# Patient Record
Sex: Male | Born: 1955 | Race: White | Hispanic: No | Marital: Married | Smoking: Former smoker
Health system: Southern US, Community
[De-identification: ages and names within clinical notes are randomized; demographics above are authoritative.]

## PROBLEM LIST (undated history)

## (undated) DIAGNOSIS — K219 Gastro-esophageal reflux disease without esophagitis: Secondary | ICD-10-CM

## (undated) DIAGNOSIS — F3130 Bipolar disorder, current episode depressed, mild or moderate severity, unspecified: Secondary | ICD-10-CM

## (undated) DIAGNOSIS — R251 Tremor, unspecified: Secondary | ICD-10-CM

## (undated) DIAGNOSIS — G4730 Sleep apnea, unspecified: Secondary | ICD-10-CM

## (undated) DIAGNOSIS — M549 Dorsalgia, unspecified: Secondary | ICD-10-CM

## (undated) DIAGNOSIS — H9190 Unspecified hearing loss, unspecified ear: Secondary | ICD-10-CM

## (undated) DIAGNOSIS — I499 Cardiac arrhythmia, unspecified: Secondary | ICD-10-CM

## (undated) DIAGNOSIS — M1990 Unspecified osteoarthritis, unspecified site: Secondary | ICD-10-CM

## (undated) DIAGNOSIS — G629 Polyneuropathy, unspecified: Secondary | ICD-10-CM

## (undated) HISTORY — DX: Bipolar disorder, current episode depressed, mild or moderate severity, unspecified: F31.30

## (undated) HISTORY — PX: WISDOM TOOTH EXTRACTION: SHX21

## (undated) HISTORY — DX: Cardiac arrhythmia, unspecified: I49.9

## (undated) HISTORY — DX: Gastro-esophageal reflux disease without esophagitis: K21.9

## (undated) HISTORY — PX: CERVICAL SPINE SURGERY: SHX589

## (undated) HISTORY — DX: Unspecified hearing loss, unspecified ear: H91.90

## (undated) HISTORY — DX: Polyneuropathy, unspecified: G62.9

## (undated) HISTORY — PX: TONSILLECTOMY: SUR1361

## (undated) HISTORY — PX: BACK SURGERY: SHX140

---

## 2001-04-20 HISTORY — PX: KNEE SURGERY: SHX244

## 2003-03-13 ENCOUNTER — Other Ambulatory Visit: Payer: Self-pay

## 2004-01-24 ENCOUNTER — Ambulatory Visit: Payer: Self-pay | Admitting: Orthopedic Surgery

## 2004-03-21 ENCOUNTER — Emergency Department: Payer: Self-pay | Admitting: Emergency Medicine

## 2005-03-02 ENCOUNTER — Ambulatory Visit: Payer: Self-pay | Admitting: Physician Assistant

## 2005-07-10 ENCOUNTER — Emergency Department: Payer: Self-pay | Admitting: Emergency Medicine

## 2006-01-03 ENCOUNTER — Emergency Department: Payer: Self-pay | Admitting: General Practice

## 2007-02-20 ENCOUNTER — Emergency Department: Payer: Self-pay | Admitting: Emergency Medicine

## 2007-02-28 ENCOUNTER — Ambulatory Visit: Payer: Self-pay | Admitting: Family Medicine

## 2007-04-22 ENCOUNTER — Emergency Department: Payer: Self-pay | Admitting: Emergency Medicine

## 2007-04-28 ENCOUNTER — Emergency Department: Payer: Self-pay | Admitting: Emergency Medicine

## 2007-05-26 ENCOUNTER — Ambulatory Visit: Payer: Self-pay | Admitting: Gastroenterology

## 2007-08-13 ENCOUNTER — Emergency Department: Payer: Self-pay | Admitting: Emergency Medicine

## 2007-08-14 ENCOUNTER — Ambulatory Visit: Payer: Self-pay | Admitting: Family Medicine

## 2007-08-17 ENCOUNTER — Ambulatory Visit: Payer: Self-pay | Admitting: Internal Medicine

## 2008-05-28 ENCOUNTER — Ambulatory Visit: Payer: Self-pay | Admitting: Internal Medicine

## 2008-07-25 ENCOUNTER — Ambulatory Visit: Payer: Self-pay | Admitting: Internal Medicine

## 2008-07-27 ENCOUNTER — Ambulatory Visit: Payer: Self-pay | Admitting: Internal Medicine

## 2008-08-20 ENCOUNTER — Ambulatory Visit: Payer: Self-pay | Admitting: Unknown Physician Specialty

## 2008-08-21 ENCOUNTER — Ambulatory Visit: Payer: Self-pay | Admitting: Unknown Physician Specialty

## 2010-10-16 ENCOUNTER — Ambulatory Visit: Payer: Self-pay

## 2011-02-22 ENCOUNTER — Inpatient Hospital Stay: Payer: Self-pay | Admitting: Internal Medicine

## 2011-02-26 DIAGNOSIS — I369 Nonrheumatic tricuspid valve disorder, unspecified: Secondary | ICD-10-CM

## 2011-02-27 DIAGNOSIS — I4891 Unspecified atrial fibrillation: Secondary | ICD-10-CM

## 2011-03-11 ENCOUNTER — Ambulatory Visit: Payer: Self-pay | Admitting: Internal Medicine

## 2012-01-18 ENCOUNTER — Ambulatory Visit: Payer: Self-pay | Admitting: Otolaryngology

## 2012-02-03 ENCOUNTER — Ambulatory Visit: Payer: Self-pay | Admitting: Ophthalmology

## 2012-05-20 ENCOUNTER — Ambulatory Visit: Payer: Self-pay | Admitting: Family Medicine

## 2012-06-06 ENCOUNTER — Other Ambulatory Visit (HOSPITAL_COMMUNITY): Payer: Self-pay

## 2012-06-07 ENCOUNTER — Other Ambulatory Visit (HOSPITAL_COMMUNITY): Payer: Self-pay | Admitting: Neurology

## 2012-06-07 DIAGNOSIS — G8190 Hemiplegia, unspecified affecting unspecified side: Secondary | ICD-10-CM

## 2012-06-10 ENCOUNTER — Ambulatory Visit (HOSPITAL_COMMUNITY): Payer: Medicaid Other | Attending: Internal Medicine

## 2012-06-10 DIAGNOSIS — G8190 Hemiplegia, unspecified affecting unspecified side: Secondary | ICD-10-CM

## 2012-06-10 DIAGNOSIS — I080 Rheumatic disorders of both mitral and aortic valves: Secondary | ICD-10-CM | POA: Insufficient documentation

## 2012-06-10 NOTE — Progress Notes (Signed)
Echocardiogram performed.  

## 2012-06-13 ENCOUNTER — Encounter (HOSPITAL_COMMUNITY): Payer: Self-pay | Admitting: Neurology

## 2012-07-25 DIAGNOSIS — Z0289 Encounter for other administrative examinations: Secondary | ICD-10-CM

## 2012-09-30 ENCOUNTER — Ambulatory Visit: Payer: Self-pay | Admitting: Neurology

## 2012-10-19 ENCOUNTER — Encounter: Payer: Self-pay | Admitting: General Surgery

## 2012-10-29 ENCOUNTER — Emergency Department: Payer: Self-pay | Admitting: Emergency Medicine

## 2012-11-14 ENCOUNTER — Encounter: Payer: Self-pay | Admitting: General Surgery

## 2012-11-14 ENCOUNTER — Inpatient Hospital Stay
Admission: RE | Admit: 2012-11-14 | Discharge: 2012-11-14 | Disposition: A | Discharge status: Home / Self Care | Payer: Self-pay | Source: Ambulatory Visit | Attending: General Surgery | Admitting: General Surgery

## 2012-11-14 ENCOUNTER — Ambulatory Visit (INDEPENDENT_AMBULATORY_CARE_PROVIDER_SITE_OTHER): Payer: Medicare Other | Admitting: General Surgery

## 2012-11-14 VITALS — BP 124/70 | HR 74 | Resp 12 | Ht 69.0 in | Wt 224.0 lb

## 2012-11-14 DIAGNOSIS — Z803 Family history of malignant neoplasm of breast: Secondary | ICD-10-CM

## 2012-11-14 DIAGNOSIS — N644 Mastodynia: Secondary | ICD-10-CM

## 2012-11-14 NOTE — Patient Instructions (Addendum)
Patient has been scheduled for a bilateral diagnostic mammogram at [facility] for 11-15-12 at 12:30 pm. He is aware of date, time, and instructions.

## 2012-11-14 NOTE — Progress Notes (Signed)
Patient ID: Jake Richards, male   DOB: July 13, 1955, 57 y.o.   MRN: [id number]  Chief Complaint  Patient presents with  . Other    breast pain    HPI Derron Pipkins is a 57 y.o. male  Here for bilateral breast pain that started 6-8 months ago. Pain seems worse with palpation or even light pressure or dressing. Pain is described as a sharp pain that is short in duration. Patient says this pain has been getting worse. Patient reports no breast enlargement or nipple discharge.  The primary care notes of April 2014 reported his symptoms have been ongoing about 4-6 weeks.  The patient reports of the right breast is significantly more uncomfortable the left. In the left breast, discomfort is limited to the area of the nipple. In the right breast a 6 cm area center of the nipple/areola is symptomatic with light touch.  The patient has never appreciated any mass in the breast. Of note, the patient's symptoms began within one month of beginning Cymbalta and Lamictal.  HPI  Past Medical History  Diagnosis Date  . Bipolar affect, depressed   . Hearing loss   . Neuropathy     Past Surgical History  Procedure Laterality Date  . Knee surgery Left 2003  . Back surgery  2000,2010    Family History  Problem Relation Age of Onset  . Breast cancer Daughter     Social History History  Substance Use Topics  . Smoking status: Former Smoker    Types: Cigarettes    Quit date: 04/20/2002  . Smokeless tobacco: Never Used  . Alcohol Use: Yes    Allergies  Allergen Reactions  . Latex Shortness Of Breath    Peels skin off  . Penicillins Shortness Of Breath    Current Outpatient Prescriptions  Medication Sig Dispense Refill  . citalopram (CELEXA) 20 MG tablet Take 1 tablet by mouth daily. Take 20 mg by mouth daily.      . DULoxetine (CYMBALTA) 30 MG capsule Take 1 capsule by mouth 2 (two) times daily. Take 30 mg by mouth daily.      . fluticasone (FLONASE) 50 MCG/ACT nasal spray Place 1 spray into  the nose. Place 2 sprays into both nostrils daily.      Marland Kitchen gabapentin (NEURONTIN) 400 MG capsule Take 1 capsule by mouth 3 (three) times daily. Take 400 mg by mouth 3 (three) times daily.      Marland Kitchen lamoTRIgine (LAMICTAL) 100 MG tablet Take 1 tablet by mouth daily. Take 100 mg by mouth daily.      . tamsulosin (FLOMAX) 0.4 MG CAPS Take 0.4 mg by mouth daily.      Marland Kitchen aspirin 81 MG tablet Take 81 mg by mouth daily.       No current facility-administered medications for this visit.    Review of Systems Review of Systems  Constitutional: Negative.   Respiratory: Positive for shortness of breath (after weight gain). Negative for apnea, cough, choking, chest tightness, wheezing and stridor.   Cardiovascular: Negative.     Blood pressure 124/70, pulse 74, resp. rate 12, height 5\' 9"  (1.753 m), weight 224 lb (101.606 kg).  Physical Exam Physical Exam  Constitutional: He is oriented to person, place, and time. He appears well-developed and well-nourished.  Neck: Neck supple.  Cardiovascular: Normal rate and regular rhythm.   Pulmonary/Chest: Effort normal and breath sounds normal. Right breast exhibits tenderness (upper outer quadrant). Right breast exhibits no inverted nipple, no mass, no nipple discharge  and no skin change. Left breast exhibits no inverted nipple, no mass, no nipple discharge, no skin change and no tenderness.  Lymphadenopathy:    He has no cervical adenopathy.    He has no axillary adenopathy.  Neurological: He is alert and oriented to person, place, and time.  There is no visual erythema, induration or skin thickening appreciated in either breast. The nipple areolar complex is normal. There is tenderness in the 9 to 11:00 position of the right breast without a discernible mass or focal thickening appreciated.  Data Reviewed Extensive laboratory studies April 2014 were reviewed. Normal testosterone, estrogen and prolactin levels appreciated.  Ultrasound examination of the right  breast from the 8 to 12:00 position with special attention in the upper-outer quadrant at the 10:00 position was completed. No evidence of architectural distortion, cystic or solid lesions identified.  Limited examination of the retroareolar tissue bilaterally showed no abnormality.  Assessment    Mastalgia, likely medication induced.     Plan    Mammograms will be obtained to complete his evaluation.  The patient reports his lower had breast cancer in her 30s. He was asked to find out whether she was tested for BRCA  Abnormality. If positive, the patient would be a candidate for similar testing.     Patient has been scheduled for a bilateral diagnostic mammogram at [facility] for 11-15-12 at 12:30 pm. He is aware of date, time, and instructions.    Earline Mayotte 11/14/2012, 8:23 PM

## 2012-11-15 ENCOUNTER — Ambulatory Visit: Payer: Self-pay | Admitting: General Surgery

## 2013-02-23 ENCOUNTER — Other Ambulatory Visit: Payer: Self-pay

## 2013-06-16 ENCOUNTER — Ambulatory Visit: Payer: Self-pay | Admitting: Otolaryngology

## 2013-08-09 ENCOUNTER — Emergency Department: Payer: Self-pay | Admitting: Emergency Medicine

## 2013-08-09 LAB — BASIC METABOLIC PANEL
Anion Gap: 6 — ABNORMAL LOW (ref 7–16)
BUN: 12 mg/dL (ref 7–18)
CHLORIDE: 107 mmol/L (ref 98–107)
Calcium, Total: 8.7 mg/dL (ref 8.5–10.1)
Co2: 27 mmol/L (ref 21–32)
Creatinine: 1.02 mg/dL (ref 0.60–1.30)
EGFR (African American): >60
GLUCOSE: 160 mg/dL — AB (ref 65–99)
OSMOLALITY: 283 (ref 275–301)
Potassium: 3.8 mmol/L (ref 3.5–5.1)
SODIUM: 140 mmol/L (ref 136–145)

## 2013-08-09 LAB — CBC
HCT: 47.5 % (ref 40.0–52.0)
HGB: 16.2 g/dL (ref 13.0–18.0)
MCH: 32 pg (ref 26.0–34.0)
MCHC: 34.1 g/dL (ref 32.0–36.0)
MCV: 94 fL (ref 80–100)
PLATELETS: 257 10*3/uL (ref 150–440)
RBC: 5.06 10*6/uL (ref 4.40–5.90)
RDW: 13.6 % (ref 11.5–14.5)
WBC: 11.8 10*3/uL — ABNORMAL HIGH (ref 3.8–10.6)

## 2013-08-10 LAB — TROPONIN I
Troponin-I: <0.02 ng/mL
Troponin-I: <0.02 ng/mL

## 2014-03-07 ENCOUNTER — Emergency Department: Payer: Self-pay | Admitting: Emergency Medicine

## 2014-03-20 ENCOUNTER — Ambulatory Visit: Payer: Self-pay | Admitting: Neurology

## 2014-04-04 ENCOUNTER — Ambulatory Visit: Payer: Self-pay | Admitting: Pain Medicine

## 2014-04-18 ENCOUNTER — Ambulatory Visit: Payer: Self-pay | Admitting: Pain Medicine

## 2014-06-08 ENCOUNTER — Ambulatory Visit: Payer: Self-pay | Admitting: Pain Medicine

## 2014-07-10 ENCOUNTER — Ambulatory Visit: Payer: Self-pay | Admitting: Pain Medicine

## 2014-07-18 ENCOUNTER — Ambulatory Visit
Admit: 2014-07-18 | Disposition: A | Discharge status: Home / Self Care | Payer: Self-pay | Attending: Pain Medicine | Admitting: Pain Medicine

## 2014-08-08 ENCOUNTER — Ambulatory Visit
Admit: 2014-08-08 | Disposition: A | Discharge status: Home / Self Care | Payer: Self-pay | Attending: Pain Medicine | Admitting: Pain Medicine

## 2014-09-11 ENCOUNTER — Ambulatory Visit: Payer: Medicaid Other | Admitting: Pain Medicine

## 2015-03-10 ENCOUNTER — Emergency Department
Admission: EM | Admit: 2015-03-10 | Discharge: 2015-03-10 | Disposition: A | Discharge status: Home / Self Care | Payer: Medicare Other | Attending: Emergency Medicine | Admitting: Emergency Medicine

## 2015-03-10 DIAGNOSIS — R3989 Other symptoms and signs involving the genitourinary system: Secondary | ICD-10-CM | POA: Insufficient documentation

## 2015-03-10 DIAGNOSIS — Z91040 Latex allergy status: Secondary | ICD-10-CM | POA: Insufficient documentation

## 2015-03-10 DIAGNOSIS — M549 Dorsalgia, unspecified: Secondary | ICD-10-CM | POA: Diagnosis not present

## 2015-03-10 DIAGNOSIS — Z880 Allergy status to penicillin: Secondary | ICD-10-CM | POA: Diagnosis not present

## 2015-03-10 DIAGNOSIS — Z7982 Long term (current) use of aspirin: Secondary | ICD-10-CM | POA: Insufficient documentation

## 2015-03-10 DIAGNOSIS — Z79899 Other long term (current) drug therapy: Secondary | ICD-10-CM | POA: Insufficient documentation

## 2015-03-10 DIAGNOSIS — R109 Unspecified abdominal pain: Secondary | ICD-10-CM | POA: Diagnosis not present

## 2015-03-10 DIAGNOSIS — R300 Dysuria: Secondary | ICD-10-CM | POA: Diagnosis present

## 2015-03-10 DIAGNOSIS — Z87891 Personal history of nicotine dependence: Secondary | ICD-10-CM | POA: Diagnosis not present

## 2015-03-10 LAB — BASIC METABOLIC PANEL
Anion gap: 6 (ref 5–15)
BUN: 11 mg/dL (ref 6–20)
CHLORIDE: 106 mmol/L (ref 101–111)
CO2: 28 mmol/L (ref 22–32)
CREATININE: 1 mg/dL (ref 0.61–1.24)
Calcium: 9 mg/dL (ref 8.9–10.3)
GFR calc non Af Amer: >60 mL/min (ref >60)
Glucose, Bld: 105 mg/dL — ABNORMAL HIGH (ref 65–99)
Potassium: 3.4 mmol/L — ABNORMAL LOW (ref 3.5–5.1)
Sodium: 140 mmol/L (ref 135–145)

## 2015-03-10 LAB — URINALYSIS COMPLETE WITH MICROSCOPIC (ARMC ONLY)
Bacteria, UA: NONE SEEN
Bilirubin Urine: NEGATIVE
GLUCOSE, UA: NEGATIVE mg/dL
Hgb urine dipstick: NEGATIVE
KETONES UR: NEGATIVE mg/dL
Leukocytes, UA: NEGATIVE
Nitrite: NEGATIVE
PROTEIN: NEGATIVE mg/dL
Specific Gravity, Urine: 1.018 (ref 1.005–1.030)
Squamous Epithelial / LPF: NONE SEEN
pH: 6 (ref 5.0–8.0)

## 2015-03-10 LAB — CBC WITH DIFFERENTIAL/PLATELET
Basophils Absolute: 0.1 10*3/uL (ref 0–0.1)
Basophils Relative: 1 %
EOS ABS: 0.3 10*3/uL (ref 0–0.7)
Eosinophils Relative: 4 %
HCT: 39.6 % — ABNORMAL LOW (ref 40.0–52.0)
HEMOGLOBIN: 13.5 g/dL (ref 13.0–18.0)
LYMPHS ABS: 2.4 10*3/uL (ref 1.0–3.6)
Lymphocytes Relative: 33 %
MCH: 31.5 pg (ref 26.0–34.0)
MCHC: 34.1 g/dL (ref 32.0–36.0)
MCV: 92.3 fL (ref 80.0–100.0)
Monocytes Absolute: 0.9 10*3/uL (ref 0.2–1.0)
Monocytes Relative: 12 %
Neutro Abs: 3.7 10*3/uL (ref 1.4–6.5)
Neutrophils Relative %: 50 %
PLATELETS: 240 10*3/uL (ref 150–440)
RBC: 4.28 MIL/uL — ABNORMAL LOW (ref 4.40–5.90)
RDW: 13.5 % (ref 11.5–14.5)
WBC: 7.3 10*3/uL (ref 3.8–10.6)

## 2015-03-10 MED ORDER — SODIUM CHLORIDE 0.9 % IV BOLUS (SEPSIS)
1000.0000 mL | Freq: Once | INTRAVENOUS | Status: AC
  Administered 2015-03-10: 1000 mL via INTRAVENOUS
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
## 2015-03-10 NOTE — ED Notes (Signed)
Pt placed on 2L, pt asleep in the bed, O2 dropped to 88%. Pt now 95% on 2L.

## 2015-03-10 NOTE — ED Notes (Signed)
Bladder scan 282mL.
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
## 2015-03-10 NOTE — ED Notes (Signed)
Pt continues to sleep at this time. NAD noted. Pt's son at bedside. Awaiting MD at this time.
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
## 2015-03-10 NOTE — ED Notes (Signed)
NAD Noted at this time. Pt taken to the lobby via wheelchair. Pt denies questions/concerns at this time.

## 2015-05-27 ENCOUNTER — Emergency Department: Payer: Medicare Other

## 2015-05-27 ENCOUNTER — Emergency Department
Admission: EM | Admit: 2015-05-27 | Discharge: 2015-05-27 | Disposition: A | Discharge status: Home / Self Care | Payer: Medicare Other | Attending: Emergency Medicine | Admitting: Emergency Medicine

## 2015-05-27 DIAGNOSIS — Y9389 Activity, other specified: Secondary | ICD-10-CM | POA: Insufficient documentation

## 2015-05-27 DIAGNOSIS — X58XXXA Exposure to other specified factors, initial encounter: Secondary | ICD-10-CM | POA: Diagnosis not present

## 2015-05-27 DIAGNOSIS — S46811A Strain of other muscles, fascia and tendons at shoulder and upper arm level, right arm, initial encounter: Secondary | ICD-10-CM | POA: Insufficient documentation

## 2015-05-27 DIAGNOSIS — Y998 Other external cause status: Secondary | ICD-10-CM | POA: Diagnosis not present

## 2015-05-27 DIAGNOSIS — Z792 Long term (current) use of antibiotics: Secondary | ICD-10-CM | POA: Diagnosis not present

## 2015-05-27 DIAGNOSIS — Z7982 Long term (current) use of aspirin: Secondary | ICD-10-CM | POA: Diagnosis not present

## 2015-05-27 DIAGNOSIS — Z79899 Other long term (current) drug therapy: Secondary | ICD-10-CM | POA: Diagnosis not present

## 2015-05-27 DIAGNOSIS — Z880 Allergy status to penicillin: Secondary | ICD-10-CM | POA: Diagnosis not present

## 2015-05-27 DIAGNOSIS — Z87891 Personal history of nicotine dependence: Secondary | ICD-10-CM | POA: Insufficient documentation

## 2015-05-27 DIAGNOSIS — Z91040 Latex allergy status: Secondary | ICD-10-CM | POA: Diagnosis not present

## 2015-05-27 DIAGNOSIS — Y9289 Other specified places as the place of occurrence of the external cause: Secondary | ICD-10-CM | POA: Insufficient documentation

## 2015-05-27 DIAGNOSIS — S4991XA Unspecified injury of right shoulder and upper arm, initial encounter: Secondary | ICD-10-CM | POA: Diagnosis present

## 2015-05-27 DIAGNOSIS — Z791 Long term (current) use of non-steroidal anti-inflammatories (NSAID): Secondary | ICD-10-CM | POA: Insufficient documentation

## 2015-05-27 MED ORDER — KETOROLAC TROMETHAMINE 30 MG/ML IJ SOLN
INTRAMUSCULAR | Status: AC
  Filled 2015-05-27: qty 1

## 2015-05-27 MED ORDER — OXYCODONE-ACETAMINOPHEN 5-325 MG PO TABS
1.0000 | ORAL_TABLET | Freq: Four times a day (QID) | ORAL | Status: DC | PRN
Start: 2015-05-27 — End: 2015-12-31

## 2015-05-27 MED ORDER — NAPROXEN 500 MG PO TABS: 500.0000 mg | ORAL_TABLET | Freq: Two times a day (BID) | ORAL | Status: DC

## 2015-05-27 MED ORDER — KETOROLAC TROMETHAMINE 60 MG/2ML IM SOLN
30.0000 mg | Freq: Once | INTRAMUSCULAR | Status: AC
  Administered 2015-05-27: 30 mg via INTRAMUSCULAR
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
## 2015-05-27 NOTE — Discharge Instructions (Signed)
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
## 2015-05-31 ENCOUNTER — Other Ambulatory Visit: Payer: Self-pay | Admitting: Surgery

## 2015-05-31 ENCOUNTER — Ambulatory Visit
Admission: RE | Admit: 2015-05-31 | Discharge: 2015-05-31 | Disposition: A | Discharge status: Home / Self Care | Payer: Medicare Other | Source: Ambulatory Visit | Attending: Surgery | Admitting: Surgery

## 2015-05-31 DIAGNOSIS — S46311A Strain of muscle, fascia and tendon of triceps, right arm, initial encounter: Secondary | ICD-10-CM | POA: Diagnosis present

## 2015-06-08 ENCOUNTER — Encounter: Payer: Self-pay | Admitting: Emergency Medicine

## 2015-06-08 ENCOUNTER — Emergency Department
Admission: EM | Admit: 2015-06-08 | Discharge: 2015-06-08 | Disposition: A | Discharge status: Home / Self Care | Payer: Medicare Other | Attending: Emergency Medicine | Admitting: Emergency Medicine

## 2015-06-08 DIAGNOSIS — M25511 Pain in right shoulder: Secondary | ICD-10-CM

## 2015-06-08 DIAGNOSIS — Z87891 Personal history of nicotine dependence: Secondary | ICD-10-CM | POA: Diagnosis not present

## 2015-06-08 DIAGNOSIS — Z79899 Other long term (current) drug therapy: Secondary | ICD-10-CM | POA: Diagnosis not present

## 2015-06-08 DIAGNOSIS — Z7982 Long term (current) use of aspirin: Secondary | ICD-10-CM | POA: Diagnosis not present

## 2015-06-08 DIAGNOSIS — Z791 Long term (current) use of non-steroidal anti-inflammatories (NSAID): Secondary | ICD-10-CM | POA: Diagnosis not present

## 2015-06-08 DIAGNOSIS — Z792 Long term (current) use of antibiotics: Secondary | ICD-10-CM | POA: Insufficient documentation

## 2015-06-08 DIAGNOSIS — Z91040 Latex allergy status: Secondary | ICD-10-CM | POA: Insufficient documentation

## 2015-06-08 DIAGNOSIS — R200 Anesthesia of skin: Secondary | ICD-10-CM | POA: Insufficient documentation

## 2015-06-08 DIAGNOSIS — Z880 Allergy status to penicillin: Secondary | ICD-10-CM | POA: Diagnosis not present

## 2015-06-08 DIAGNOSIS — M79601 Pain in right arm: Secondary | ICD-10-CM | POA: Diagnosis present

## 2015-06-08 MED ORDER — HYDROMORPHONE HCL 1 MG/ML IJ SOLN
1.0000 mg | Freq: Once | INTRAMUSCULAR | Status: AC
  Administered 2015-06-08: 1 mg via INTRAMUSCULAR
  Filled 2015-06-08: qty 1

## 2015-06-08 MED ORDER — GABAPENTIN 300 MG PO CAPS: ORAL_CAPSULE | ORAL | Status: DC

## 2015-06-08 MED ORDER — KETOROLAC TROMETHAMINE 60 MG/2ML IM SOLN
60.0000 mg | Freq: Once | INTRAMUSCULAR | Status: DC
Start: 2015-06-08 — End: 2015-06-08

## 2015-06-08 NOTE — Discharge Instructions (Signed)
Continue taking your current medication prescribed by Dr. Manuella Ghazi. Call Monday for further instructions.
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
## 2015-06-08 NOTE — ED Notes (Signed)
Pt states he will call ride in order to take stronger pain medication.  Pt informed pain medication won't be ordered until ride arrives.  Pt verbalized understanding
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
## 2015-08-28 ENCOUNTER — Encounter: Payer: Self-pay | Admitting: Emergency Medicine

## 2015-08-28 ENCOUNTER — Emergency Department
Admission: EM | Admit: 2015-08-28 | Discharge: 2015-08-29 | Disposition: A | Discharge status: Home / Self Care | Payer: Medicare Other | Attending: Emergency Medicine | Admitting: Emergency Medicine

## 2015-08-28 DIAGNOSIS — T672XXA Heat cramp, initial encounter: Secondary | ICD-10-CM

## 2015-08-28 DIAGNOSIS — F319 Bipolar disorder, unspecified: Secondary | ICD-10-CM | POA: Insufficient documentation

## 2015-08-28 DIAGNOSIS — Z87891 Personal history of nicotine dependence: Secondary | ICD-10-CM | POA: Diagnosis not present

## 2015-08-28 DIAGNOSIS — R9431 Abnormal electrocardiogram [ECG] [EKG]: Secondary | ICD-10-CM

## 2015-08-28 DIAGNOSIS — I4581 Long QT syndrome: Secondary | ICD-10-CM | POA: Insufficient documentation

## 2015-08-28 DIAGNOSIS — X30XXXA Exposure to excessive natural heat, initial encounter: Secondary | ICD-10-CM | POA: Diagnosis not present

## 2015-08-28 DIAGNOSIS — Y9389 Activity, other specified: Secondary | ICD-10-CM | POA: Insufficient documentation

## 2015-08-28 DIAGNOSIS — Y929 Unspecified place or not applicable: Secondary | ICD-10-CM | POA: Insufficient documentation

## 2015-08-28 DIAGNOSIS — Y999 Unspecified external cause status: Secondary | ICD-10-CM | POA: Diagnosis not present

## 2015-08-28 DIAGNOSIS — T675XXA Heat exhaustion, unspecified, initial encounter: Secondary | ICD-10-CM | POA: Insufficient documentation

## 2015-08-28 DIAGNOSIS — Z7982 Long term (current) use of aspirin: Secondary | ICD-10-CM | POA: Diagnosis not present

## 2015-08-28 LAB — CBC WITH DIFFERENTIAL/PLATELET
Basophils Absolute: 0.1 10*3/uL (ref 0–0.1)
Eosinophils Absolute: 0.2 10*3/uL (ref 0–0.7)
Eosinophils Relative: 2 %
HEMATOCRIT: 39.2 % — AB (ref 40.0–52.0)
HEMOGLOBIN: 13.7 g/dL (ref 13.0–18.0)
Lymphs Abs: 1.9 10*3/uL (ref 1.0–3.6)
MCH: 32.1 pg (ref 26.0–34.0)
MCHC: 35 g/dL (ref 32.0–36.0)
MCV: 91.5 fL (ref 80.0–100.0)
MONO ABS: 1.1 10*3/uL — AB (ref 0.2–1.0)
NEUTROS ABS: 6.7 10*3/uL — AB (ref 1.4–6.5)
Neutrophils Relative %: 67 %
Platelets: 249 10*3/uL (ref 150–440)
RBC: 4.29 MIL/uL — ABNORMAL LOW (ref 4.40–5.90)
RDW: 13.9 % (ref 11.5–14.5)
WBC: 9.9 10*3/uL (ref 3.8–10.6)

## 2015-08-28 LAB — BASIC METABOLIC PANEL
ANION GAP: 9 (ref 5–15)
BUN: 16 mg/dL (ref 6–20)
CALCIUM: 9.6 mg/dL (ref 8.9–10.3)
CHLORIDE: 107 mmol/L (ref 101–111)
CO2: 24 mmol/L (ref 22–32)
Creatinine, Ser: 1.4 mg/dL — ABNORMAL HIGH (ref 0.61–1.24)
GFR calc non Af Amer: 54 mL/min — ABNORMAL LOW (ref >60)
GLUCOSE: 87 mg/dL (ref 65–99)
POTASSIUM: 4 mmol/L (ref 3.5–5.1)
Sodium: 140 mmol/L (ref 135–145)

## 2015-08-28 LAB — MAGNESIUM: MAGNESIUM: 1.8 mg/dL (ref 1.7–2.4)

## 2015-08-28 LAB — CK: Total CK: 232 U/L (ref 49–397)

## 2015-08-28 MED ORDER — MORPHINE SULFATE (PF) 4 MG/ML IV SOLN
4.0000 mg | Freq: Once | INTRAVENOUS | Status: AC
  Administered 2015-08-28: 4 mg via INTRAVENOUS
  Filled 2015-08-28: qty 1

## 2015-08-28 MED ORDER — SODIUM CHLORIDE 0.9 % IV BOLUS (SEPSIS)
1000.0000 mL | Freq: Once | INTRAVENOUS | Status: AC
  Administered 2015-08-28: 1000 mL via INTRAVENOUS

## 2015-08-28 NOTE — ED Notes (Signed)
Pt from home via EMS for heat cramps after mowing the yard , felt sick and continued to go. Pt is visably cramping in bilat lower extremeties per EMS. Pt A&O  Pt got 5mg  valum and 30 torado, 500ML NS , has lac ringers going currently,

## 2015-08-28 NOTE — ED Provider Notes (Signed)
[facility] Emergency Department Provider Note   ____________________________________________  Time seen: Approximately 8 PM  I have reviewed the triage vital signs and the nursing notes.   HISTORY  Chief Complaint No chief complaint on file.    HPI Jake Richards is a 60 y.o. male with a history of bipolar as well as peripheral neuropathy was presenting to the emergency department today after nursing will episode. He said he was outside in the heat all day mowing his lawn when he started to become nauseous. He then had cramps in his legs and had a hot sensation come over his body. At that point he put his head under the sink and ran cold water over himself. He says that he still is mild cramping to his lower extremities. Was drinking water and Gatorade all day. Denies any chest pain or palpitations.  Past Medical History  Diagnosis Date  . Bipolar affect, depressed ([place])   . Hearing loss   . Neuropathy ([place])   . GERD (gastroesophageal reflux disease)     Patient Active Problem List   Diagnosis Date Noted  . Mastalgia 11/14/2012    Past Surgical History  Procedure Laterality Date  . Knee surgery Left 2003  . Back surgery  2000,2010    Current Outpatient Rx  Name  Route  Sig  Dispense  Refill  . ALPRAZolam (XANAX) 1 MG tablet   Oral   Take 1 mg by mouth 2 (two) times daily.           Dispense as written.   Marland Kitchen aspirin 81 MG tablet   Oral   Take 81 mg by mouth daily.         . citalopram (CELEXA) 20 MG tablet   Oral   Take 1 tablet by mouth daily. Take 20 mg by mouth daily.         Marland Kitchen doxycycline (VIBRAMYCIN) 100 MG capsule   Oral   Take 100 mg by mouth 2 (two) times daily. 1 tab po bid         . DULoxetine (CYMBALTA) 30 MG capsule   Oral   Take 1 capsule by mouth 2 (two) times daily. Take 30 mg by mouth daily.         Marland Kitchen EXPIRED: fluticasone (FLONASE) 50 MCG/ACT nasal spray   Nasal   Place 1 spray into the nose.  Place 2 sprays into both nostrils daily.         Marland Kitchen EXPIRED: gabapentin (NEURONTIN) 300 MG capsule      Day 1 take one tablet, then begin 1 tablet bid   20 capsule   2   . HYDROcodone-acetaminophen (NORCO) 10-325 MG per tablet   Oral   Take 1 tablet by mouth every 6 (six) hours as needed. 1/2 to 1 tab po a day or bid if tolerated.         . lamoTRIgine (LAMICTAL) 100 MG tablet   Oral   Take 1 tablet by mouth daily. Take 100 mg by mouth daily.         . naproxen (NAPROSYN) 500 MG tablet   Oral   Take 1 tablet (500 mg total) by mouth 2 (two) times daily with a meal.   20 tablet   2   . omeprazole (PRILOSEC) 40 MG capsule   Oral   Take 40 mg by mouth daily.         Marland Kitchen oxyCODONE-acetaminophen (ROXICET) 5-325 MG tablet   Oral  Take 1 tablet by mouth every 6 (six) hours as needed.   20 tablet   0   . ranitidine (ZANTAC) 150 MG capsule   Oral   Take 150 mg by mouth 2 (two) times daily.         . tamsulosin (FLOMAX) 0.4 MG CAPS   Oral   Take 0.4 mg by mouth daily.           Allergies Latex; Penicillins; Codeine; Fentanyl; and Sulfa antibiotics  Family History  Problem Relation Age of Onset  . Breast cancer Daughter     Social History Social History  Substance Use Topics  . Smoking status: Former Smoker    Types: Cigarettes    Quit date: 04/20/2002  . Smokeless tobacco: Never Used  . Alcohol Use: Yes    Review of Systems Constitutional: No fever/chills Eyes: No visual changes. ENT: No sore throat. Cardiovascular: Denies chest pain. Respiratory: Denies shortness of breath. Gastrointestinal: No abdominal pain.  No nausea, no vomiting.  No diarrhea.  No constipation. Genitourinary: Negative for dysuria. Musculoskeletal: Negative for back pain. Skin: Negative for rash. Neurological: Negative for headaches, focal weakness or numbness.  10-point ROS otherwise negative.  ____________________________________________   PHYSICAL EXAM:  VITAL  SIGNS: ED Triage Vitals  Enc Vitals Group     BP 08/28/15 1927 126/80 mmHg     Pulse Rate 08/28/15 1927 102     Resp 08/28/15 1927 24     Temp 08/28/15 1927 98.2 F (36.8 C)     Temp Source 08/28/15 1927 Oral     SpO2 08/28/15 1927 99 %     Weight --      Height --      Head Cir --      Peak Flow --      Pain Score 08/28/15 1929 6     Pain Loc --      Pain Edu? --      Excl. in [place]? --     Constitutional: Alert and oriented. Well appearing and in no acute distress. Eyes: Conjunctivae are normal. PERRL. EOMI. Head: Atraumatic. Nose: No congestion/rhinnorhea. Mouth/Throat: Mucous membranes are moist.   Neck: No stridor.   Cardiovascular: Normal rate, regular rhythm. Grossly normal heart sounds.   Respiratory: Normal respiratory effort.  No retractions. Lungs CTAB. Gastrointestinal: Soft and nontender. No distention. No abdominal bruits. No CVA tenderness. Musculoskeletal: No lower extremity tenderness nor edema.  No joint effusions. Neurologic:  Normal speech and language. No gross focal neurologic deficits are appreciated.  Skin:  Skin is warm, dry and intact. No rash noted. Psychiatric: Mood and affect are normal. Speech and behavior are normal.  ____________________________________________   LABS (all labs ordered are listed, but only abnormal results are displayed)  Labs Reviewed  CBC WITH DIFFERENTIAL/PLATELET - Abnormal; Notable for the following:    RBC 4.29 (*)    HCT 39.2 (*)    Neutro Abs 6.7 (*)    Monocytes Absolute 1.1 (*)    All other components within normal limits  BASIC METABOLIC PANEL  CK  MAGNESIUM   ____________________________________________  EKG  ED ECG REPORT I, Doran Stabler, the attending physician, personally viewed and interpreted this ECG.   Date: 08/28/2015  EKG Time: 2030  Rate: 83  Rhythm: normal sinus rhythm  Axis: Normal axis  Intervals:QT interval at 505  ST&T Change: No ST segment elevation or depression. No  abnormal T-wave inversion  ____________________________________________  RADIOLOGY   ____________________________________________   PROCEDURES  ____________________________________________   INITIAL IMPRESSION / ASSESSMENT AND PLAN / ED COURSE  Pertinent labs & imaging results that were available during my care of the patient were reviewed by me and considered in my medical decision making (see chart for details).  ----------------------------------------- 11:28 PM on 08/28/2015 -----------------------------------------  Patient now feeling much improved. Cramps are gone. Patient eating. Likely diagnosis of heat exhaustion and heat cramps. The patient did have a long QT interval which I believe is more incidental that anything. I discussed this with him as well as his family over the bedside. He'll be following up with his primary care doctor about this. ____________________________________________   FINAL CLINICAL IMPRESSION(S) / ED DIAGNOSES  Heat exhaustion. He cramps. Long QT.    NEW MEDICATIONS STARTED DURING THIS VISIT:  New Prescriptions   No medications on file     Note:  This document was prepared using Dragon voice recognition software and may include unintentional dictation errors.    Orbie Pyo, MD 08/28/15 [phone]

## 2015-08-28 NOTE — ED Notes (Signed)
Pt provided paper scrubs for discharge

## 2015-10-04 ENCOUNTER — Other Ambulatory Visit: Payer: Self-pay | Admitting: Neurology

## 2015-10-04 DIAGNOSIS — R51 Headache: Secondary | ICD-10-CM

## 2015-10-04 DIAGNOSIS — R519 Headache, unspecified: Secondary | ICD-10-CM

## 2015-10-04 DIAGNOSIS — R531 Weakness: Secondary | ICD-10-CM

## 2015-10-04 DIAGNOSIS — R42 Dizziness and giddiness: Secondary | ICD-10-CM

## 2015-10-09 ENCOUNTER — Ambulatory Visit
Admission: RE | Admit: 2015-10-09 | Discharge: 2015-10-09 | Disposition: A | Discharge status: Home / Self Care | Payer: Medicare Other | Source: Ambulatory Visit | Attending: Neurology | Admitting: Neurology

## 2015-10-09 DIAGNOSIS — R42 Dizziness and giddiness: Secondary | ICD-10-CM | POA: Diagnosis present

## 2015-10-09 DIAGNOSIS — R51 Headache: Secondary | ICD-10-CM | POA: Diagnosis present

## 2015-10-09 DIAGNOSIS — R519 Headache, unspecified: Secondary | ICD-10-CM

## 2015-10-09 DIAGNOSIS — R531 Weakness: Secondary | ICD-10-CM | POA: Diagnosis not present

## 2015-10-09 MED ORDER — GADOBENATE DIMEGLUMINE 529 MG/ML IV SOLN
20.0000 mL | Freq: Once | INTRAVENOUS | Status: AC | PRN
  Administered 2015-10-09: 18 mL via INTRAVENOUS

## 2015-12-31 ENCOUNTER — Emergency Department: Payer: Medicare Other

## 2015-12-31 ENCOUNTER — Emergency Department
Admission: EM | Admit: 2015-12-31 | Discharge: 2015-12-31 | Disposition: A | Discharge status: Home / Self Care | Payer: Medicare Other | Attending: Emergency Medicine | Admitting: Emergency Medicine

## 2015-12-31 DIAGNOSIS — Z7982 Long term (current) use of aspirin: Secondary | ICD-10-CM | POA: Insufficient documentation

## 2015-12-31 DIAGNOSIS — Z87891 Personal history of nicotine dependence: Secondary | ICD-10-CM | POA: Diagnosis not present

## 2015-12-31 DIAGNOSIS — M545 Low back pain: Secondary | ICD-10-CM | POA: Diagnosis present

## 2015-12-31 DIAGNOSIS — M5441 Lumbago with sciatica, right side: Secondary | ICD-10-CM | POA: Insufficient documentation

## 2015-12-31 DIAGNOSIS — M5431 Sciatica, right side: Secondary | ICD-10-CM

## 2015-12-31 DIAGNOSIS — Z79899 Other long term (current) drug therapy: Secondary | ICD-10-CM | POA: Insufficient documentation

## 2015-12-31 MED ORDER — HYDROMORPHONE HCL 1 MG/ML IJ SOLN
1.0000 mg | Freq: Once | INTRAMUSCULAR | Status: AC
  Administered 2015-12-31: 1 mg via INTRAVENOUS
  Filled 2015-12-31: qty 1

## 2015-12-31 MED ORDER — OXYCODONE-ACETAMINOPHEN 5-325 MG PO TABS: 1.0000 | ORAL_TABLET | Freq: Four times a day (QID) | ORAL | 0 refills | Status: DC | PRN

## 2015-12-31 MED ORDER — DEXAMETHASONE SODIUM PHOSPHATE 10 MG/ML IJ SOLN
10.0000 mg | Freq: Once | INTRAMUSCULAR | Status: AC
  Administered 2015-12-31: 10 mg via INTRAVENOUS
  Filled 2015-12-31: qty 1

## 2015-12-31 MED ORDER — OXYCODONE-ACETAMINOPHEN 5-325 MG PO TABS
1.0000 | ORAL_TABLET | Freq: Once | ORAL | Status: AC
  Administered 2015-12-31: 1 via ORAL
  Filled 2015-12-31: qty 1

## 2015-12-31 MED ORDER — PREDNISONE 10 MG PO TABS: ORAL_TABLET | ORAL | 0 refills | Status: DC

## 2015-12-31 NOTE — ED Notes (Signed)
Discharge instructions reviewed with patient. Questions fielded by this RN. Patient verbalizes understanding of instructions. Patient discharged home in stable condition per [facility] . No acute distress noted at time of discharge.

## 2015-12-31 NOTE — ED Triage Notes (Signed)
Pt to triage via w/c with no distress noted; reports missed a step yesterday and fell; c/o lower back pain radiating down right leg since; st hx back surgery

## 2015-12-31 NOTE — ED Notes (Signed)
Pt fell today co lower back pain and right leg numbness. Hx of back problems with surgery in the past.

## 2015-12-31 NOTE — ED Notes (Signed)
Pt up in the room and hallway with walker. Pt able to walk with walker.

## 2015-12-31 NOTE — ED Provider Notes (Signed)
[facility] Provider Note   CSN: [id number] Arrival date & time: 12/31/15  2027     History   Chief Complaint Chief Complaint  Patient presents with  . Back Pain    HPI Jake Richards is a 60 y.o. male presents of her department for evaluation of lower back pain and right lumbar radicular symptoms. Patient states he fell one day ago, slipped and fell onto his buttocks. Developed right buttocks pain with sharp shooting burning, numbness and tingling going down the right lateral thigh, anterior shin and into the right foot. His pain is constant down the right leg with no weakness or loss of bowel or bladder symptoms. His creatinine taking gabapentin and methocarbamol. His pain is severe 10 out of 10. He has pain with ambulation, standing and sitting.  HPI  Past Medical History:  Diagnosis Date  . Bipolar affect, depressed ([place])   . GERD (gastroesophageal reflux disease)   . Hearing loss   . Neuropathy [facility])     Patient Active Problem List   Diagnosis Date Noted  . Mastalgia 11/14/2012    Past Surgical History:  Procedure Laterality Date  . BACK SURGERY  2000,2010  . KNEE SURGERY Left 2003       Home Medications    Prior to Admission medications   Medication Sig Start Date End Date Taking? Authorizing Provider  ALPRAZolam Duanne Moron) 1 MG tablet Take 1 mg by mouth 2 (two) times daily.    Historical Provider, MD  aspirin 81 MG tablet Take 81 mg by mouth daily.    Historical Provider, MD  citalopram (CELEXA) 20 MG tablet Take 1 tablet by mouth daily. Take 20 mg by mouth daily.    Historical Provider, MD  doxycycline (VIBRAMYCIN) 100 MG capsule Take 100 mg by mouth 2 (two) times daily. 1 tab po bid    Historical Provider, MD  DULoxetine (CYMBALTA) 30 MG capsule Take 1 capsule by mouth 2 (two) times daily. Take 30 mg by mouth daily.    Historical Provider, MD  fluticasone (FLONASE) 50 MCG/ACT nasal spray Place 1 spray into the nose. Place 2 sprays into  both nostrils daily. 06/30/12 06/30/13  Historical Provider, MD  gabapentin (NEURONTIN) 300 MG capsule Day 1 take one tablet, then begin 1 tablet bid 06/08/15 06/18/15  Johnn Hai, PA-C  HYDROcodone-acetaminophen [facility]) 10-325 MG per tablet Take 1 tablet by mouth every 6 (six) hours as needed. 1/2 to 1 tab po a day or bid if tolerated.    Historical Provider, MD  lamoTRIgine (LAMICTAL) 100 MG tablet Take 1 tablet by mouth daily. Take 100 mg by mouth daily.    Historical Provider, MD  naproxen (NAPROSYN) 500 MG tablet Take 1 tablet (500 mg total) by mouth 2 (two) times daily with a meal. 05/27/15   Lavonia Drafts, MD  omeprazole (PRILOSEC) 40 MG capsule Take 40 mg by mouth daily.    Historical Provider, MD  oxyCODONE-acetaminophen (ROXICET) 5-325 MG tablet Take 1-2 tablets by mouth every 6 (six) hours as needed for severe pain. 12/31/15   Duanne Guess, PA-C  predniSONE (DELTASONE) 10 MG tablet 10 day taper. 5,5,4,4,3,3,2,2,1,1 12/31/15   Duanne Guess, PA-C  ranitidine (ZANTAC) 150 MG capsule Take 150 mg by mouth 2 (two) times daily.    Historical Provider, MD  tamsulosin (FLOMAX) 0.4 MG CAPS Take 0.4 mg by mouth daily.    Historical Provider, MD    Family History Family History  Problem Relation Age of Onset  .  Breast cancer Daughter     Social History Social History  Substance Use Topics  . Smoking status: Former Smoker    Types: Cigarettes    Quit date: 04/20/2002  . Smokeless tobacco: Never Used  . Alcohol use Yes     Allergies   Latex; Penicillins; Codeine; Fentanyl; and Sulfa antibiotics   Review of Systems Review of Systems  Constitutional: Negative for chills and fever.  HENT: Negative for ear pain and sore throat.   Eyes: Negative for pain and visual disturbance.  Respiratory: Negative for cough and shortness of breath.   Cardiovascular: Negative for chest pain and palpitations.  Gastrointestinal: Negative for abdominal pain and vomiting.  Genitourinary: Negative for  dysuria and hematuria.  Musculoskeletal: Positive for back pain. Negative for arthralgias.  Skin: Negative for color change and rash.  Neurological: Negative for seizures and syncope.  All other systems reviewed and are negative.    Physical Exam Updated Vital Signs BP 130/87 (BP Location: Left Arm)   Pulse (!) 106   Temp 98.4 F (36.9 C) (Oral)   Resp 20   Ht 5\' 9"  (1.753 m)   Wt 84.4 kg   SpO2 97%   BMI 27.47 kg/m   Physical Exam  Constitutional: He appears well-developed and well-nourished.  HENT:  Head: Normocephalic and atraumatic.  Eyes: Conjunctivae are normal.  Neck: Neck supple.  Cardiovascular: Normal rate and regular rhythm.   No murmur heard. Pulmonary/Chest: Effort normal and breath sounds normal. No respiratory distress.  Abdominal: Soft. There is no tenderness.  Musculoskeletal:  Lumbar Spine: Examination of the lumbar spine reveals no bony abnormality, no edema, and no ecchymosis.  There is no step off.  The patient has decreased range of motion of the lumbar spine with flexion and extension.  The patient has decreased lateral bend and rotation.  The patient has moderate pain with lumbar flexion and extension.  The patient is non tender along the spinous process.  The patient is non tender along the paravertebral muscles, with no muscle spasms.  The patient is non tender along the iliac crest.  The patient is  tender in the right sciatic notch.  The patient is moderately tender along the right Sacroiliac joint.  There is no Coccyx joint tenderness.    Bilateral Lower Extremities: Examination of the lower extremities reveals no bony abnormality, no edema, and no ecchymosis.  The patient has full active and passive range of motion of the hips, knees, and ankles.  There is no discomfort with range of motion exercises.  The patient is non tender along the greater trochanter region.  The patient has a negative Bevelyn Buckles' test bilaterally.  There is normal skin warmth.   There is normal capillary refill bilaterally.    Neurologic: The patient has a positive right straight leg raise.  The patient has normal muscle strength testing for the quadriceps, calves, ankle dorsiflexion, ankle plantarflexion, and extensor hallicus longus.  The patient has sensation that is intact to light touch.  The deep tendon reflexes are normal at the patella bilaterally.   Neurological: He is alert.  Skin: Skin is warm and dry.  Psychiatric: He has a normal mood and affect.  Nursing note and vitals reviewed.    ED Treatments / Results  Labs (all labs ordered are listed, but only abnormal results are displayed) Labs Reviewed - No data to display  EKG  EKG Interpretation None       Radiology Dg Lumbar Spine 2-3 Views  Result Date:  12/31/2015 CLINICAL DATA:  Status post fall. Low back pain radiating to right leg. EXAM: LUMBAR SPINE - 2-3 VIEW COMPARISON:  MR lumbar spine 03/20/2014 FINDINGS: Alignment of the lumbar spine is normal. There is disc spacer material and narrowing of the disc space of L5-S1. Mild disc space narrowing at L3-L4. The other lumbar disc spaces are preserved. There is moderate facet arthrosis at the lower lumbar levels. There is no acute fracture or static subluxation. There is atherosclerotic calcification within the aorta. IMPRESSION: 1. No acute fracture or static subluxation of the lumbar spine. 2. Lower lumbar degenerative disc disease and facet arthrosis. 3. Aortic atherosclerosis. Electronically Signed   By: Ulyses Jarred M.D.   On: 12/31/2015 22:20   Dg Pelvis 1-2 Views  Result Date: 12/31/2015 CLINICAL DATA:  Right pelvic pain EXAM: PELVIS - 1-2 VIEW COMPARISON:  None. FINDINGS: There is no evidence of pelvic fracture or diastasis. No pelvic bone lesions are seen. Limited views of the hips show no acute abnormality. Visualized bowel gas pattern is normal. IMPRESSION: No pelvic fracture. Electronically Signed   By: Ulyses Jarred M.D.   On:  12/31/2015 22:21    Procedures Procedures (including critical care time)  Medications Ordered in ED Medications  oxyCODONE-acetaminophen (PERCOCET/ROXICET) 5-325 MG per tablet 1 tablet (not administered)  HYDROmorphone (DILAUDID) injection 1 mg (1 mg Intravenous Given 12/31/15 2137)  dexamethasone (DECADRON) injection 10 mg (10 mg Intravenous Given 12/31/15 2303)     Initial Impression / Assessment and Plan / ED Course  I have reviewed the triage vital signs and the nursing notes.  Pertinent labs & imaging results that were available during my care of the patient were reviewed by me and considered in my medical decision making (see chart for details).  Clinical Course    60 year old male with acute right lumbar radiculopathy. Patient with severe pain, right L5 distribution. No motor deficits on exam. Sensation intact distally. Patient is given 1 mg Dilaudid IM, 10 mg of dexamethasone IV. Patient able to ambulate with a walker. He is sent home with Percocet and 10 day steroid taper. We'll call orthopedics in 1 day to schedule follow-up appointment. He is educated on signs and symptoms returned from urgent for  Final Clinical Impressions(s) / ED Diagnoses   Final diagnoses:  Sciatica of right side  Right-sided low back pain with right-sided sciatica    New Prescriptions New Prescriptions   OXYCODONE-ACETAMINOPHEN (ROXICET) 5-325 MG TABLET    Take 1-2 tablets by mouth every 6 (six) hours as needed for severe pain.   PREDNISONE (DELTASONE) 10 MG TABLET    10 day taper. 5,5,4,4,3,3,2,2,1,1     Duanne Guess, PA-C 12/31/15 2337    Nance Pear, MD 01/02/16 [phone]

## 2015-12-31 NOTE — Discharge Instructions (Signed)
Please take medications as prescribed. Call orthopedics tomorrow to schedule follow-up appointment.

## 2015-12-31 NOTE — ED Notes (Signed)
Pt concerned because he cant go home because of the pain. Pt states he cant move the right leg. Pt states he has pins and hardware that could be messed up.  Arvella Nigh PA made aware and talking to the patient at this time.

## 2015-12-31 NOTE — ED Notes (Addendum)
Pt states he fell yesterday on his right side and since then his lower back has been hurting with pain radiating to rightleg

## 2016-01-03 ENCOUNTER — Other Ambulatory Visit: Payer: Self-pay | Admitting: Orthopedic Surgery

## 2016-01-03 DIAGNOSIS — M5441 Lumbago with sciatica, right side: Secondary | ICD-10-CM

## 2016-01-04 ENCOUNTER — Ambulatory Visit: Admission: RE | Admit: 2016-01-04 | Payer: Medicare Other | Source: Ambulatory Visit

## 2016-01-04 ENCOUNTER — Other Ambulatory Visit: Payer: Self-pay | Admitting: Orthopedic Surgery

## 2016-01-04 ENCOUNTER — Ambulatory Visit
Admission: RE | Admit: 2016-01-04 | Discharge: 2016-01-04 | Disposition: A | Discharge status: Home / Self Care | Payer: Medicare Other | Source: Ambulatory Visit | Attending: Orthopedic Surgery | Admitting: Orthopedic Surgery

## 2016-01-04 DIAGNOSIS — M5127 Other intervertebral disc displacement, lumbosacral region: Secondary | ICD-10-CM | POA: Diagnosis not present

## 2016-01-04 DIAGNOSIS — M5416 Radiculopathy, lumbar region: Secondary | ICD-10-CM | POA: Insufficient documentation

## 2016-01-04 DIAGNOSIS — M4327 Fusion of spine, lumbosacral region: Secondary | ICD-10-CM | POA: Diagnosis not present

## 2016-01-04 DIAGNOSIS — M5126 Other intervertebral disc displacement, lumbar region: Secondary | ICD-10-CM | POA: Insufficient documentation

## 2016-01-04 DIAGNOSIS — M4806 Spinal stenosis, lumbar region: Secondary | ICD-10-CM | POA: Insufficient documentation

## 2016-05-01 ENCOUNTER — Encounter: Payer: Self-pay | Admitting: Emergency Medicine

## 2016-05-01 ENCOUNTER — Emergency Department
Admission: EM | Admit: 2016-05-01 | Discharge: 2016-05-01 | Disposition: A | Discharge status: Home / Self Care | Payer: Medicare Other | Attending: Emergency Medicine | Admitting: Emergency Medicine

## 2016-05-01 ENCOUNTER — Emergency Department: Payer: Medicare Other

## 2016-05-01 DIAGNOSIS — R05 Cough: Secondary | ICD-10-CM | POA: Diagnosis present

## 2016-05-01 DIAGNOSIS — R509 Fever, unspecified: Secondary | ICD-10-CM

## 2016-05-01 DIAGNOSIS — Z79899 Other long term (current) drug therapy: Secondary | ICD-10-CM | POA: Diagnosis not present

## 2016-05-01 DIAGNOSIS — Z7982 Long term (current) use of aspirin: Secondary | ICD-10-CM | POA: Diagnosis not present

## 2016-05-01 DIAGNOSIS — J101 Influenza due to other identified influenza virus with other respiratory manifestations: Secondary | ICD-10-CM

## 2016-05-01 DIAGNOSIS — Z87891 Personal history of nicotine dependence: Secondary | ICD-10-CM | POA: Diagnosis not present

## 2016-05-01 DIAGNOSIS — J09X2 Influenza due to identified novel influenza A virus with other respiratory manifestations: Secondary | ICD-10-CM | POA: Diagnosis not present

## 2016-05-01 DIAGNOSIS — R059 Cough, unspecified: Secondary | ICD-10-CM

## 2016-05-01 LAB — CBC WITH DIFFERENTIAL/PLATELET
BASOS ABS: 0 10*3/uL (ref 0–0.1)
BASOS PCT: 1 %
Eosinophils Absolute: 0.1 10*3/uL (ref 0–0.7)
Eosinophils Relative: 1 %
HEMATOCRIT: 39.3 % — AB (ref 40.0–52.0)
HEMOGLOBIN: 13.7 g/dL (ref 13.0–18.0)
LYMPHS PCT: 4 %
Lymphs Abs: 0.4 10*3/uL — ABNORMAL LOW (ref 1.0–3.6)
MCH: 32.3 pg (ref 26.0–34.0)
MCHC: 34.9 g/dL (ref 32.0–36.0)
MCV: 92.4 fL (ref 80.0–100.0)
MONO ABS: 1 10*3/uL (ref 0.2–1.0)
MONOS PCT: 10 %
NEUTROS ABS: 8.2 10*3/uL — AB (ref 1.4–6.5)
NEUTROS PCT: 84 %
Platelets: 260 10*3/uL (ref 150–440)
RBC: 4.25 MIL/uL — ABNORMAL LOW (ref 4.40–5.90)
RDW: 13.7 % (ref 11.5–14.5)
WBC: 9.8 10*3/uL (ref 3.8–10.6)

## 2016-05-01 LAB — COMPREHENSIVE METABOLIC PANEL
ALBUMIN: 4.3 g/dL (ref 3.5–5.0)
ALK PHOS: 88 U/L (ref 38–126)
ALT: 41 U/L (ref 17–63)
AST: 39 U/L (ref 15–41)
Anion gap: 10 (ref 5–15)
BILIRUBIN TOTAL: 0.7 mg/dL (ref 0.3–1.2)
BUN: 15 mg/dL (ref 6–20)
CALCIUM: 9.6 mg/dL (ref 8.9–10.3)
CO2: 25 mmol/L (ref 22–32)
CREATININE: 0.97 mg/dL (ref 0.61–1.24)
Chloride: 101 mmol/L (ref 101–111)
GFR calc Af Amer: >60 mL/min (ref >60)
GLUCOSE: 114 mg/dL — AB (ref 65–99)
POTASSIUM: 3.9 mmol/L (ref 3.5–5.1)
Sodium: 136 mmol/L (ref 135–145)
TOTAL PROTEIN: 7.4 g/dL (ref 6.5–8.1)

## 2016-05-01 LAB — INFLUENZA PANEL BY PCR (TYPE A & B)
INFLBPCR: NEGATIVE
Influenza A By PCR: POSITIVE — AB

## 2016-05-01 LAB — TROPONIN I

## 2016-05-01 MED ORDER — PREDNISONE 20 MG PO TABS: 40.0000 mg | ORAL_TABLET | Freq: Every day | ORAL | 0 refills | Status: DC

## 2016-05-01 MED ORDER — SODIUM CHLORIDE 0.9 % IV BOLUS (SEPSIS)
1000.0000 mL | Freq: Once | INTRAVENOUS | Status: AC
  Administered 2016-05-01: 1000 mL via INTRAVENOUS

## 2016-05-01 MED ORDER — HYDROCOD POLST-CPM POLST ER 10-8 MG/5ML PO SUER: 5.0000 mL | Freq: Every evening | ORAL | 0 refills | Status: DC | PRN

## 2016-05-01 MED ORDER — SODIUM CHLORIDE 0.9 % IV BOLUS (SEPSIS)
1000.0000 mL | Freq: Once | INTRAVENOUS | Status: AC
Start: 2016-05-01 — End: 2016-05-01
  Administered 2016-05-01: 1000 mL via INTRAVENOUS

## 2016-05-01 MED ORDER — OSELTAMIVIR PHOSPHATE 75 MG PO CAPS: 75.0000 mg | ORAL_CAPSULE | Freq: Two times a day (BID) | ORAL | 0 refills | Status: AC

## 2016-05-01 MED ORDER — OSELTAMIVIR PHOSPHATE 75 MG PO CAPS
75.0000 mg | ORAL_CAPSULE | Freq: Once | ORAL | Status: AC
  Administered 2016-05-01: 75 mg via ORAL
  Filled 2016-05-01: qty 1

## 2016-05-01 MED ORDER — ACETAMINOPHEN 500 MG PO TABS
1000.0000 mg | ORAL_TABLET | Freq: Once | ORAL | Status: AC
  Administered 2016-05-01: 1000 mg via ORAL
  Filled 2016-05-01: qty 2

## 2016-05-01 MED ORDER — IBUPROFEN 800 MG PO TABS
ORAL_TABLET | ORAL | Status: AC
  Filled 2016-05-01: qty 1

## 2016-05-01 MED ORDER — IBUPROFEN 800 MG PO TABS
800.0000 mg | ORAL_TABLET | Freq: Once | ORAL | Status: AC
  Administered 2016-05-01: 800 mg via ORAL

## 2016-05-01 NOTE — Discharge Instructions (Signed)
Please make sure you drinking lots of fluids. Alternate Tylenol and ibuprofen for fevers, body aches. Return to the ER for any difficulty breathing, fevers that are not responding to ibuprofen and Tylenol, for any worsening symptoms urgent changes in her health.

## 2016-05-01 NOTE — ED Notes (Signed)
Patient c/o dizziness, productive cough, nasal drainage/congestion, headache, bilateral ear pain, neck pain beginning yesterday.

## 2016-05-01 NOTE — ED Notes (Signed)
ED Provider at bedside. 

## 2016-05-01 NOTE — ED Provider Notes (Signed)
[facility] Provider Note   CSN: [id number] Arrival date & time: 05/01/16  1815     History   Chief Complaint Chief Complaint  Patient presents with  . Headache  . Otalgia    HPI Jake Richards is a 61 y.o. male presents to the emergency department for evaluation of headache, body aches, cough, runny nose, sore throat. Patient states he had sudden onset of symptoms last night. His had fevers up to 101. He has taken one dose of Tylenol earlier today. He hasn't taken any ibuprofen. He denies any chest pain or shortness of breath. His cough has been productive. No abdominal pain nausea vomiting or diarrhea. Patient denies any neck pain.  HPI  Past Medical History:  Diagnosis Date  . Bipolar affect, depressed ([place])   . GERD (gastroesophageal reflux disease)   . Hearing loss   . Neuropathy [facility])     Patient Active Problem List   Diagnosis Date Noted  . Mastalgia 11/14/2012    Past Surgical History:  Procedure Laterality Date  . BACK SURGERY  2000,2010  . KNEE SURGERY Left 2003       Home Medications    Prior to Admission medications   Medication Sig Start Date End Date Taking? Authorizing Provider  ALPRAZolam Duanne Moron) 1 MG tablet Take 1 mg by mouth 2 (two) times daily.    Historical Provider, MD  aspirin 81 MG tablet Take 81 mg by mouth daily.    Historical Provider, MD  chlorpheniramine-HYDROcodone (TUSSIONEX PENNKINETIC ER) 10-8 MG/5ML SUER Take 5 mLs by mouth at bedtime as needed for cough. 05/01/16   Duanne Guess, PA-C  citalopram (CELEXA) 20 MG tablet Take 1 tablet by mouth daily. Take 20 mg by mouth daily.    Historical Provider, MD  doxycycline (VIBRAMYCIN) 100 MG capsule Take 100 mg by mouth 2 (two) times daily. 1 tab po bid    Historical Provider, MD  DULoxetine (CYMBALTA) 30 MG capsule Take 1 capsule by mouth 2 (two) times daily. Take 30 mg by mouth daily.    Historical Provider, MD  fluticasone (FLONASE) 50 MCG/ACT nasal spray Place 1  spray into the nose. Place 2 sprays into both nostrils daily. 06/30/12 06/30/13  Historical Provider, MD  gabapentin (NEURONTIN) 300 MG capsule Day 1 take one tablet, then begin 1 tablet bid 06/08/15 06/18/15  Johnn Hai, PA-C  HYDROcodone-acetaminophen [facility]) 10-325 MG per tablet Take 1 tablet by mouth every 6 (six) hours as needed. 1/2 to 1 tab po a day or bid if tolerated.    Historical Provider, MD  lamoTRIgine (LAMICTAL) 100 MG tablet Take 1 tablet by mouth daily. Take 100 mg by mouth daily.    Historical Provider, MD  naproxen (NAPROSYN) 500 MG tablet Take 1 tablet (500 mg total) by mouth 2 (two) times daily with a meal. 05/27/15   Lavonia Drafts, MD  omeprazole (PRILOSEC) 40 MG capsule Take 40 mg by mouth daily.    Historical Provider, MD  oseltamivir (TAMIFLU) 75 MG capsule Take 1 capsule (75 mg total) by mouth 2 (two) times daily. 05/01/16 05/06/16  Duanne Guess, PA-C  oxyCODONE-acetaminophen (ROXICET) 5-325 MG tablet Take 1-2 tablets by mouth every 6 (six) hours as needed for severe pain. 12/31/15   Duanne Guess, PA-C  predniSONE (DELTASONE) 20 MG tablet Take 2 tablets (40 mg total) by mouth daily. 05/01/16   Duanne Guess, PA-C  ranitidine (ZANTAC) 150 MG capsule Take 150 mg by mouth 2 (two) times daily.  Historical Provider, MD  tamsulosin (FLOMAX) 0.4 MG CAPS Take 0.4 mg by mouth daily.    Historical Provider, MD    Family History Family History  Problem Relation Age of Onset  . Breast cancer Daughter     Social History Social History  Substance Use Topics  . Smoking status: Former Smoker    Types: Cigarettes    Quit date: 04/20/2002  . Smokeless tobacco: Never Used  . Alcohol use No     Allergies   Latex; Penicillins; Codeine; Fentanyl; and Sulfa antibiotics   Review of Systems Review of Systems  Constitutional: Positive for fatigue and fever. Negative for activity change, appetite change and chills.  HENT: Positive for congestion, ear pain, rhinorrhea and sore  throat. Negative for hearing loss, mouth sores, sinus pressure and trouble swallowing.   Eyes: Negative for photophobia, pain and discharge.  Respiratory: Positive for cough. Negative for chest tightness, shortness of breath and wheezing.   Cardiovascular: Negative for chest pain and leg swelling.  Gastrointestinal: Negative for abdominal distention, abdominal pain, diarrhea, nausea and vomiting.  Genitourinary: Negative for difficulty urinating and dysuria.  Musculoskeletal: Positive for myalgias. Negative for arthralgias, back pain, gait problem and neck pain.  Skin: Negative for color change and rash.  Neurological: Negative for dizziness and headaches.  Hematological: Negative for adenopathy.  Psychiatric/Behavioral: Negative for agitation and behavioral problems.     Physical Exam Updated Vital Signs BP 122/64 (BP Location: Right Arm)   Pulse (!) 119   Temp 99.3 F (37.4 C) (Oral)   Resp 18   Ht 5\' 9"  (1.753 m)   Wt 84.4 kg   SpO2 94%   BMI 27.47 kg/m   Physical Exam  Constitutional: He appears well-developed and well-nourished.  HENT:  Head: Normocephalic and atraumatic.  Right Ear: External ear normal.  Left Ear: External ear normal.  Nose: Rhinorrhea present.  Mouth/Throat: Oropharynx is clear and moist. No oropharyngeal exudate.  Eyes: Conjunctivae are normal. Right eye exhibits no discharge. Left eye exhibits no discharge.  Neck: Normal range of motion. Neck supple.  Cardiovascular: Regular rhythm, normal heart sounds and intact distal pulses.   No murmur heard. Pulmonary/Chest: Effort normal and breath sounds normal. No respiratory distress. He has no wheezes. He has no rales. He exhibits no tenderness.  Abdominal: Soft. Bowel sounds are normal. He exhibits no distension. There is no tenderness. There is no guarding.  Musculoskeletal: Normal range of motion. He exhibits no edema.  Lymphadenopathy:    He has cervical adenopathy.  Neurological: He is alert.  Coordination normal.  Skin: Skin is warm and dry.  Psychiatric: He has a normal mood and affect. His behavior is normal. Judgment and thought content normal.  Nursing note and vitals reviewed.    ED Treatments / Results  Labs (all labs ordered are listed, but only abnormal results are displayed) Labs Reviewed  INFLUENZA PANEL BY PCR (TYPE A & B, H1N1) - Abnormal; Notable for the following:       Result Value   Influenza A By PCR POSITIVE (*)    All other components within normal limits  CBC WITH DIFFERENTIAL/PLATELET - Abnormal; Notable for the following:    RBC 4.25 (*)    HCT 39.3 (*)    Neutro Abs 8.2 (*)    Lymphs Abs 0.4 (*)    All other components within normal limits  COMPREHENSIVE METABOLIC PANEL - Abnormal; Notable for the following:    Glucose, Bld 114 (*)    All other  components within normal limits  TROPONIN I    EKG  EKG Interpretation None       Radiology Dg Chest 2 View  Result Date: 05/01/2016 CLINICAL DATA:  Dizziness with productive cough. EXAM: CHEST  2 VIEW COMPARISON:  08/09/2013 FINDINGS: The lungs are clear wiithout focal pneumonia, edema, pneumothorax or pleural effusion. The cardiopericardial silhouette is within normal limits for size. The visualized bony structures of the thorax are intact. IMPRESSION: No active cardiopulmonary disease. Electronically Signed   By: Misty Stanley M.D.   On: 05/01/2016 20:40    Procedures Procedures (including critical care time)  Medications Ordered in ED Medications  sodium chloride 0.9 % bolus 1,000 mL (0 mLs Intravenous Stopped 05/01/16 1923)  acetaminophen (TYLENOL) tablet 1,000 mg (1,000 mg Oral Given 05/01/16 2014)  sodium chloride 0.9 % bolus 1,000 mL (1,000 mLs Intravenous New Bag/Given 05/01/16 2048)  oseltamivir (TAMIFLU) capsule 75 mg (75 mg Oral Given 05/01/16 2132)  ibuprofen (ADVIL,MOTRIN) tablet 800 mg (800 mg Oral Given 05/01/16 2136)     Initial Impression / Assessment and Plan / ED Course  I  have reviewed the triage vital signs and the nursing notes.  Pertinent labs & imaging results that were available during my care of the patient were reviewed by me and considered in my medical decision making (see chart for details).  Clinical Course     61 year old male with sudden onset of fevers, body aches, cough congestion, runny nose. Positive for influenza A. Chest x-ray negative for any acute cardiopulmonary process. Patient given Tylenol, ibuprofen, 2 L of fluids. Heart rate and temperature improved. Patient is educated on staying hydrated and controlling his fever with Tylenol and ibuprofen. He is given medications to help treat his symptoms. He is educated on signs and symptoms return to the emergency department for. Patient stable and ready for discharge home.  Final Clinical Impressions(s) / ED Diagnoses   Final diagnoses:  Cough  Influenza A  Fever, unspecified fever cause    New Prescriptions New Prescriptions   CHLORPHENIRAMINE-HYDROCODONE (TUSSIONEX PENNKINETIC ER) 10-8 MG/5ML SUER    Take 5 mLs by mouth at bedtime as needed for cough.   OSELTAMIVIR (TAMIFLU) 75 MG CAPSULE    Take 1 capsule (75 mg total) by mouth 2 (two) times daily.   PREDNISONE (DELTASONE) 20 MG TABLET    Take 2 tablets (40 mg total) by mouth daily.     Duanne Guess, PA-C 05/01/16 2236    Harvest Dark, MD 05/01/16 2237

## 2016-05-01 NOTE — ED Triage Notes (Signed)
Pt ambulatory to triage in NAD, reports awoke today with headache and bilateral otalgia.  Pt w/ low grade fever and tachycardia in triage.

## 2016-07-21 ENCOUNTER — Emergency Department: Payer: Medicare Other

## 2016-07-21 ENCOUNTER — Observation Stay
Admission: EM | Admit: 2016-07-21 | Discharge: 2016-07-23 | Disposition: A | Discharge status: Home / Self Care | Payer: Medicare Other | Attending: Internal Medicine | Admitting: Internal Medicine

## 2016-07-21 ENCOUNTER — Encounter: Payer: Self-pay | Admitting: Emergency Medicine

## 2016-07-21 DIAGNOSIS — M329 Systemic lupus erythematosus, unspecified: Secondary | ICD-10-CM | POA: Insufficient documentation

## 2016-07-21 DIAGNOSIS — N179 Acute kidney failure, unspecified: Secondary | ICD-10-CM | POA: Diagnosis not present

## 2016-07-21 DIAGNOSIS — E119 Type 2 diabetes mellitus without complications: Secondary | ICD-10-CM | POA: Diagnosis not present

## 2016-07-21 DIAGNOSIS — R51 Headache: Secondary | ICD-10-CM | POA: Diagnosis not present

## 2016-07-21 DIAGNOSIS — F319 Bipolar disorder, unspecified: Secondary | ICD-10-CM | POA: Diagnosis not present

## 2016-07-21 DIAGNOSIS — Z880 Allergy status to penicillin: Secondary | ICD-10-CM | POA: Diagnosis not present

## 2016-07-21 DIAGNOSIS — Z888 Allergy status to other drugs, medicaments and biological substances status: Secondary | ICD-10-CM | POA: Insufficient documentation

## 2016-07-21 DIAGNOSIS — G894 Chronic pain syndrome: Secondary | ICD-10-CM | POA: Insufficient documentation

## 2016-07-21 DIAGNOSIS — I493 Ventricular premature depolarization: Secondary | ICD-10-CM | POA: Diagnosis not present

## 2016-07-21 DIAGNOSIS — I10 Essential (primary) hypertension: Secondary | ICD-10-CM | POA: Diagnosis not present

## 2016-07-21 DIAGNOSIS — Z87891 Personal history of nicotine dependence: Secondary | ICD-10-CM | POA: Insufficient documentation

## 2016-07-21 DIAGNOSIS — Z7982 Long term (current) use of aspirin: Secondary | ICD-10-CM | POA: Insufficient documentation

## 2016-07-21 DIAGNOSIS — Z818 Family history of other mental and behavioral disorders: Secondary | ICD-10-CM | POA: Insufficient documentation

## 2016-07-21 DIAGNOSIS — I6522 Occlusion and stenosis of left carotid artery: Secondary | ICD-10-CM | POA: Diagnosis not present

## 2016-07-21 DIAGNOSIS — Z823 Family history of stroke: Secondary | ICD-10-CM | POA: Insufficient documentation

## 2016-07-21 DIAGNOSIS — Z8249 Family history of ischemic heart disease and other diseases of the circulatory system: Secondary | ICD-10-CM | POA: Insufficient documentation

## 2016-07-21 DIAGNOSIS — M50322 Other cervical disc degeneration at C5-C6 level: Secondary | ICD-10-CM | POA: Insufficient documentation

## 2016-07-21 DIAGNOSIS — E785 Hyperlipidemia, unspecified: Secondary | ICD-10-CM | POA: Diagnosis present

## 2016-07-21 DIAGNOSIS — I252 Old myocardial infarction: Secondary | ICD-10-CM | POA: Insufficient documentation

## 2016-07-21 DIAGNOSIS — R4020 Unspecified coma: Secondary | ICD-10-CM

## 2016-07-21 DIAGNOSIS — H9190 Unspecified hearing loss, unspecified ear: Secondary | ICD-10-CM | POA: Insufficient documentation

## 2016-07-21 DIAGNOSIS — N644 Mastodynia: Secondary | ICD-10-CM | POA: Diagnosis not present

## 2016-07-21 DIAGNOSIS — F419 Anxiety disorder, unspecified: Secondary | ICD-10-CM | POA: Diagnosis not present

## 2016-07-21 DIAGNOSIS — Z8349 Family history of other endocrine, nutritional and metabolic diseases: Secondary | ICD-10-CM | POA: Insufficient documentation

## 2016-07-21 DIAGNOSIS — R55 Syncope and collapse: Principal | ICD-10-CM | POA: Diagnosis present

## 2016-07-21 DIAGNOSIS — Z833 Family history of diabetes mellitus: Secondary | ICD-10-CM | POA: Insufficient documentation

## 2016-07-21 DIAGNOSIS — G629 Polyneuropathy, unspecified: Secondary | ICD-10-CM | POA: Diagnosis not present

## 2016-07-21 DIAGNOSIS — Z83511 Family history of glaucoma: Secondary | ICD-10-CM | POA: Insufficient documentation

## 2016-07-21 DIAGNOSIS — K219 Gastro-esophageal reflux disease without esophagitis: Secondary | ICD-10-CM | POA: Diagnosis not present

## 2016-07-21 DIAGNOSIS — M542 Cervicalgia: Secondary | ICD-10-CM

## 2016-07-21 DIAGNOSIS — R519 Headache, unspecified: Secondary | ICD-10-CM | POA: Diagnosis present

## 2016-07-21 DIAGNOSIS — Z91040 Latex allergy status: Secondary | ICD-10-CM | POA: Insufficient documentation

## 2016-07-21 DIAGNOSIS — Z882 Allergy status to sulfonamides status: Secondary | ICD-10-CM | POA: Insufficient documentation

## 2016-07-21 DIAGNOSIS — Z840 Family history of diseases of the skin and subcutaneous tissue: Secondary | ICD-10-CM | POA: Insufficient documentation

## 2016-07-21 DIAGNOSIS — Z79899 Other long term (current) drug therapy: Secondary | ICD-10-CM | POA: Insufficient documentation

## 2016-07-21 DIAGNOSIS — Z885 Allergy status to narcotic agent status: Secondary | ICD-10-CM | POA: Diagnosis not present

## 2016-07-21 LAB — URINE DRUG SCREEN, QUALITATIVE (ARMC ONLY)
AMPHETAMINES, UR SCREEN: NOT DETECTED
Barbiturates, Ur Screen: NOT DETECTED
Benzodiazepine, Ur Scrn: POSITIVE — AB
COCAINE METABOLITE, UR ~~LOC~~: NOT DETECTED
Cannabinoid 50 Ng, Ur ~~LOC~~: NOT DETECTED
MDMA (ECSTASY) UR SCREEN: NOT DETECTED
METHADONE SCREEN, URINE: NOT DETECTED
OPIATE, UR SCREEN: NOT DETECTED
Phencyclidine (PCP) Ur S: NOT DETECTED
TRICYCLIC, UR SCREEN: NOT DETECTED

## 2016-07-21 LAB — URINALYSIS, COMPLETE (UACMP) WITH MICROSCOPIC
BILIRUBIN URINE: NEGATIVE
Bacteria, UA: NONE SEEN
GLUCOSE, UA: NEGATIVE mg/dL
Hgb urine dipstick: NEGATIVE
KETONES UR: NEGATIVE mg/dL
LEUKOCYTES UA: NEGATIVE
Nitrite: NEGATIVE
PH: 6 (ref 5.0–8.0)
PROTEIN: NEGATIVE mg/dL
RBC / HPF: NONE SEEN RBC/hpf (ref 0–5)
Specific Gravity, Urine: 1.01 (ref 1.005–1.030)
Squamous Epithelial / LPF: NONE SEEN

## 2016-07-21 LAB — CBC
HCT: 41.9 % (ref 40.0–52.0)
HEMOGLOBIN: 14.5 g/dL (ref 13.0–18.0)
MCH: 31.7 pg (ref 26.0–34.0)
MCHC: 34.7 g/dL (ref 32.0–36.0)
MCV: 91.2 fL (ref 80.0–100.0)
PLATELETS: 255 10*3/uL (ref 150–440)
RBC: 4.59 MIL/uL (ref 4.40–5.90)
RDW: 13.1 % (ref 11.5–14.5)
WBC: 7.5 10*3/uL (ref 3.8–10.6)

## 2016-07-21 LAB — BASIC METABOLIC PANEL
Anion gap: 8 (ref 5–15)
BUN: 15 mg/dL (ref 6–20)
CO2: 29 mmol/L (ref 22–32)
CREATININE: 1.27 mg/dL — AB (ref 0.61–1.24)
Calcium: 9 mg/dL (ref 8.9–10.3)
Chloride: 96 mmol/L — ABNORMAL LOW (ref 101–111)
GFR calc Af Amer: >60 mL/min (ref >60)
GFR calc non Af Amer: >60 mL/min (ref >60)
GLUCOSE: 92 mg/dL (ref 65–99)
Potassium: 3.9 mmol/L (ref 3.5–5.1)
Sodium: 133 mmol/L — ABNORMAL LOW (ref 135–145)

## 2016-07-21 LAB — APTT: aPTT: 26 seconds (ref 24–36)

## 2016-07-21 LAB — PROTIME-INR
INR: 0.97
Prothrombin Time: 12.9 seconds (ref 11.4–15.2)

## 2016-07-21 LAB — ETHANOL: Alcohol, Ethyl (B): <5 mg/dL (ref <5)

## 2016-07-21 LAB — GLUCOSE, CAPILLARY: Glucose-Capillary: 105 mg/dL — ABNORMAL HIGH (ref 65–99)

## 2016-07-21 LAB — TROPONIN I: Troponin I: <0.03 ng/mL (ref <0.03)

## 2016-07-21 MED ORDER — ONDANSETRON HCL 4 MG/2ML IJ SOLN
4.0000 mg | Freq: Once | INTRAMUSCULAR | Status: AC
  Administered 2016-07-21: 4 mg via INTRAVENOUS
  Filled 2016-07-21: qty 2

## 2016-07-21 MED ORDER — KETOROLAC TROMETHAMINE 30 MG/ML IJ SOLN: 30.0000 mg | Freq: Once | INTRAMUSCULAR | Status: DC

## 2016-07-21 MED ORDER — MORPHINE SULFATE (PF) 4 MG/ML IV SOLN
4.0000 mg | Freq: Once | INTRAVENOUS | Status: AC
  Administered 2016-07-21: 4 mg via INTRAVENOUS
  Filled 2016-07-21: qty 1

## 2016-07-21 MED ORDER — SODIUM CHLORIDE 0.9 % IV BOLUS (SEPSIS)
1000.0000 mL | Freq: Once | INTRAVENOUS | Status: AC
  Administered 2016-07-21: 1000 mL via INTRAVENOUS

## 2016-07-21 NOTE — ED Notes (Signed)
Family with pt

## 2016-07-21 NOTE — ED Notes (Signed)
Iv fluids infusing  meds given for h/a  Family with pt.  Pt alert.  Speech clear.
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
## 2016-07-22 ENCOUNTER — Observation Stay: Payer: Medicare Other

## 2016-07-22 ENCOUNTER — Observation Stay (HOSPITAL_BASED_OUTPATIENT_CLINIC_OR_DEPARTMENT_OTHER)
Admit: 2016-07-22 | Discharge: 2016-07-22 | Disposition: A | Discharge status: Home / Self Care | Payer: Medicare Other | Attending: Cardiovascular Disease | Admitting: Cardiovascular Disease

## 2016-07-22 DIAGNOSIS — R4020 Unspecified coma: Secondary | ICD-10-CM

## 2016-07-22 DIAGNOSIS — R519 Headache, unspecified: Secondary | ICD-10-CM | POA: Diagnosis present

## 2016-07-22 DIAGNOSIS — R55 Syncope and collapse: Secondary | ICD-10-CM

## 2016-07-22 DIAGNOSIS — R51 Headache: Secondary | ICD-10-CM

## 2016-07-22 DIAGNOSIS — E785 Hyperlipidemia, unspecified: Secondary | ICD-10-CM | POA: Diagnosis present

## 2016-07-22 LAB — LIPID PANEL
Cholesterol: 169 mg/dL (ref 0–200)
HDL: 30 mg/dL — ABNORMAL LOW (ref >40)
LDL CALC: 101 mg/dL — AB (ref 0–99)
Total CHOL/HDL Ratio: 5.6 RATIO
Triglycerides: 189 mg/dL — ABNORMAL HIGH (ref <150)
VLDL: 38 mg/dL (ref 0–40)

## 2016-07-22 LAB — CBC
HCT: 39.7 % — ABNORMAL LOW (ref 40.0–52.0)
Hemoglobin: 13.7 g/dL (ref 13.0–18.0)
MCH: 31.4 pg (ref 26.0–34.0)
MCHC: 34.7 g/dL (ref 32.0–36.0)
MCV: 90.8 fL (ref 80.0–100.0)
PLATELETS: 244 10*3/uL (ref 150–440)
RBC: 4.37 MIL/uL — ABNORMAL LOW (ref 4.40–5.90)
RDW: 13.4 % (ref 11.5–14.5)
WBC: 7.3 10*3/uL (ref 3.8–10.6)

## 2016-07-22 LAB — BASIC METABOLIC PANEL
ANION GAP: 5 (ref 5–15)
BUN: 16 mg/dL (ref 6–20)
CO2: 28 mmol/L (ref 22–32)
CREATININE: 1.39 mg/dL — AB (ref 0.61–1.24)
Calcium: 8.7 mg/dL — ABNORMAL LOW (ref 8.9–10.3)
Chloride: 106 mmol/L (ref 101–111)
GFR calc Af Amer: >60 mL/min (ref >60)
GFR, EST NON AFRICAN AMERICAN: 54 mL/min — AB (ref >60)
GLUCOSE: 95 mg/dL (ref 65–99)
POTASSIUM: 4.1 mmol/L (ref 3.5–5.1)
Sodium: 139 mmol/L (ref 135–145)

## 2016-07-22 LAB — ECHOCARDIOGRAM COMPLETE
HEIGHTINCHES: 70 in
WEIGHTICAEL: 3142.88 [oz_av]

## 2016-07-22 LAB — MAGNESIUM: MAGNESIUM: 2.1 mg/dL (ref 1.7–2.4)

## 2016-07-22 LAB — TSH: TSH: 0.889 u[IU]/mL (ref 0.350–4.500)

## 2016-07-22 LAB — GLUCOSE, CAPILLARY: GLUCOSE-CAPILLARY: 92 mg/dL (ref 65–99)

## 2016-07-22 LAB — TROPONIN I

## 2016-07-22 LAB — PHOSPHORUS: PHOSPHORUS: 3.8 mg/dL (ref 2.5–4.6)

## 2016-07-22 MED ORDER — GABAPENTIN 400 MG PO CAPS
400.0000 mg | ORAL_CAPSULE | Freq: Three times a day (TID) | ORAL | Status: DC
  Administered 2016-07-22 (×3): 400 mg via ORAL
  Filled 2016-07-22 (×3): qty 1

## 2016-07-22 MED ORDER — HYDROCODONE-ACETAMINOPHEN 10-325 MG PO TABS
1.0000 | ORAL_TABLET | ORAL | Status: DC | PRN
  Administered 2016-07-22: 1 via ORAL
  Filled 2016-07-22: qty 1

## 2016-07-22 MED ORDER — FLUTICASONE PROPIONATE 50 MCG/ACT NA SUSP
1.0000 | Freq: Every day | NASAL | Status: DC
  Administered 2016-07-23: 1 via NASAL
  Filled 2016-07-22: qty 16

## 2016-07-22 MED ORDER — HYDROCODONE-ACETAMINOPHEN 10-325 MG PO TABS
1.0000 | ORAL_TABLET | Freq: Four times a day (QID) | ORAL | Status: DC | PRN
  Administered 2016-07-22 (×2): 1 via ORAL
  Filled 2016-07-22 (×2): qty 1

## 2016-07-22 MED ORDER — FAMOTIDINE 20 MG PO TABS
20.0000 mg | ORAL_TABLET | Freq: Two times a day (BID) | ORAL | Status: DC
  Administered 2016-07-22 – 2016-07-23 (×3): 20 mg via ORAL
  Filled 2016-07-22 (×3): qty 1

## 2016-07-22 MED ORDER — SODIUM CHLORIDE 0.9 % IV SOLN
INTRAVENOUS | Status: DC
  Administered 2016-07-22: 04:00:00 via INTRAVENOUS

## 2016-07-22 MED ORDER — PANTOPRAZOLE SODIUM 40 MG PO TBEC
40.0000 mg | DELAYED_RELEASE_TABLET | Freq: Every day | ORAL | Status: DC
  Administered 2016-07-22 – 2016-07-23 (×2): 40 mg via ORAL
  Filled 2016-07-22 (×2): qty 1

## 2016-07-22 MED ORDER — SENNOSIDES-DOCUSATE SODIUM 8.6-50 MG PO TABS: 1.0000 | ORAL_TABLET | Freq: Every evening | ORAL | Status: DC | PRN

## 2016-07-22 MED ORDER — CITALOPRAM HYDROBROMIDE 20 MG PO TABS
20.0000 mg | ORAL_TABLET | Freq: Every day | ORAL | Status: DC
  Administered 2016-07-22 – 2016-07-23 (×2): 20 mg via ORAL
  Filled 2016-07-22 (×2): qty 1

## 2016-07-22 MED ORDER — ONDANSETRON HCL 4 MG PO TABS: 4.0000 mg | ORAL_TABLET | Freq: Four times a day (QID) | ORAL | Status: DC | PRN

## 2016-07-22 MED ORDER — LAMOTRIGINE 100 MG PO TABS
100.0000 mg | ORAL_TABLET | Freq: Every day | ORAL | Status: DC
  Administered 2016-07-22 – 2016-07-23 (×2): 100 mg via ORAL
  Filled 2016-07-22 (×2): qty 1

## 2016-07-22 MED ORDER — ALPRAZOLAM 1 MG PO TABS
1.0000 mg | ORAL_TABLET | Freq: Two times a day (BID) | ORAL | Status: DC
  Administered 2016-07-22 – 2016-07-23 (×3): 1 mg via ORAL
  Filled 2016-07-22 (×3): qty 1

## 2016-07-22 MED ORDER — GABAPENTIN 400 MG PO CAPS
800.0000 mg | ORAL_CAPSULE | Freq: Once | ORAL | Status: AC
  Administered 2016-07-22: 800 mg via ORAL
  Filled 2016-07-22: qty 2

## 2016-07-22 MED ORDER — ACETAMINOPHEN 650 MG RE SUPP: 650.0000 mg | Freq: Four times a day (QID) | RECTAL | Status: DC | PRN

## 2016-07-22 MED ORDER — ACETAMINOPHEN 325 MG PO TABS
650.0000 mg | ORAL_TABLET | Freq: Four times a day (QID) | ORAL | Status: DC | PRN
  Filled 2016-07-22: qty 2

## 2016-07-22 MED ORDER — GABAPENTIN 400 MG PO CAPS: 1200.0000 mg | ORAL_CAPSULE | Freq: Two times a day (BID) | ORAL | Status: DC

## 2016-07-22 MED ORDER — BISACODYL 5 MG PO TBEC: 5.0000 mg | DELAYED_RELEASE_TABLET | Freq: Every day | ORAL | Status: DC | PRN

## 2016-07-22 MED ORDER — SODIUM CHLORIDE 0.9% FLUSH
3.0000 mL | Freq: Two times a day (BID) | INTRAVENOUS | Status: DC
  Administered 2016-07-22 – 2016-07-23 (×3): 3 mL via INTRAVENOUS

## 2016-07-22 MED ORDER — ONDANSETRON HCL 4 MG/2ML IJ SOLN: 4.0000 mg | Freq: Four times a day (QID) | INTRAMUSCULAR | Status: DC | PRN

## 2016-07-22 MED ORDER — ALUM & MAG HYDROXIDE-SIMETH 200-200-20 MG/5ML PO SUSP: 30.0000 mL | Freq: Four times a day (QID) | ORAL | Status: DC | PRN

## 2016-07-22 MED ORDER — GABAPENTIN 400 MG PO CAPS
1200.0000 mg | ORAL_CAPSULE | Freq: Two times a day (BID) | ORAL | Status: DC
  Administered 2016-07-23: 1200 mg via ORAL
  Filled 2016-07-22: qty 3

## 2016-07-22 MED ORDER — ASPIRIN 81 MG PO CHEW
81.0000 mg | CHEWABLE_TABLET | Freq: Every day | ORAL | Status: DC
  Administered 2016-07-22 – 2016-07-23 (×2): 81 mg via ORAL
  Filled 2016-07-22 (×2): qty 1

## 2016-07-22 MED ORDER — MAGNESIUM CITRATE PO SOLN
1.0000 | Freq: Once | ORAL | Status: DC | PRN
  Filled 2016-07-22: qty 296

## 2016-07-22 MED ORDER — VENLAFAXINE HCL 37.5 MG PO TABS
75.0000 mg | ORAL_TABLET | Freq: Two times a day (BID) | ORAL | Status: DC
  Administered 2016-07-22 – 2016-07-23 (×2): 75 mg via ORAL
  Filled 2016-07-22 (×3): qty 2

## 2016-07-22 MED ORDER — DULOXETINE HCL 30 MG PO CPEP
60.0000 mg | ORAL_CAPSULE | Freq: Two times a day (BID) | ORAL | Status: DC
  Administered 2016-07-22 – 2016-07-23 (×3): 60 mg via ORAL
  Filled 2016-07-22 (×3): qty 2

## 2016-07-22 NOTE — ED Notes (Signed)
Pt sleeping  Waiting on admission.  
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
## 2016-07-22 NOTE — Progress Notes (Signed)
*  PRELIMINARY RESULTS* Echocardiogram 2D Echocardiogram has been performed.  Sherrie Sport 07/22/2016, 1:19 PM
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
## 2016-07-22 NOTE — ED Notes (Signed)
Pt sleeping. 

## 2016-07-22 NOTE — ED Notes (Signed)
Report received from Amy, [place] at [phone]

## 2016-07-22 NOTE — ED Notes (Signed)
Pt sleeping   Sinus on monitor.  siderails up x 2.

## 2016-07-22 NOTE — Progress Notes (Signed)
Pt arrived form ED alert and oriented. Telemetry box and skin verified. No SOB, no concerns offered. VSS.

## 2016-07-22 NOTE — Care Management Obs Status (Signed)
MEDICARE OBSERVATION STATUS NOTIFICATION   Patient Details  Name: Jake Richards MRN: [id number] Date of Birth: 27-Feb-1956   Medicare Observation Status Notification Given:  Yes    Katrina Stack, RN 07/22/2016, 10:21 AM
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
## 2016-07-22 NOTE — ED Notes (Signed)
Report off to kailey rn  

## 2016-07-23 ENCOUNTER — Other Ambulatory Visit: Payer: Self-pay | Admitting: Nurse Practitioner

## 2016-07-23 ENCOUNTER — Telehealth: Payer: Self-pay | Admitting: Cardiology

## 2016-07-23 DIAGNOSIS — R55 Syncope and collapse: Secondary | ICD-10-CM | POA: Diagnosis not present

## 2016-07-23 DIAGNOSIS — M542 Cervicalgia: Secondary | ICD-10-CM

## 2016-07-23 DIAGNOSIS — R51 Headache: Secondary | ICD-10-CM | POA: Diagnosis not present

## 2016-07-23 LAB — BASIC METABOLIC PANEL
Anion gap: 4 — ABNORMAL LOW (ref 5–15)
BUN: 15 mg/dL (ref 6–20)
CO2: 31 mmol/L (ref 22–32)
Calcium: 9.2 mg/dL (ref 8.9–10.3)
Chloride: 103 mmol/L (ref 101–111)
Creatinine, Ser: 1.15 mg/dL (ref 0.61–1.24)
GFR calc Af Amer: >60 mL/min (ref >60)
GLUCOSE: 88 mg/dL (ref 65–99)
POTASSIUM: 3.9 mmol/L (ref 3.5–5.1)
Sodium: 138 mmol/L (ref 135–145)

## 2016-07-23 LAB — GLUCOSE, CAPILLARY: Glucose-Capillary: 85 mg/dL (ref 65–99)

## 2016-07-23 LAB — HIV ANTIBODY (ROUTINE TESTING W REFLEX): HIV SCREEN 4TH GENERATION: NONREACTIVE

## 2016-07-23 MED ORDER — ASPIRIN 81 MG PO TABS: 81.0000 mg | ORAL_TABLET | Freq: Every day | ORAL | 2 refills | Status: DC

## 2016-07-23 NOTE — Telephone Encounter (Signed)
TCM... Pt saw Thurmond Butts in hospital he is coming on 07/30/16 to see Dr Yvone Neu  Pt is being discharged today

## 2016-07-23 NOTE — Progress Notes (Signed)
Patient discharged home as ordered,instructions explained and well understood,follow up appointments made,prescription given,escorted by spouse and staff member via wheelchair
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
## 2016-07-23 NOTE — Telephone Encounter (Signed)
Attempted to contact pt regarding discharge from [facility] on 07/23/16. Left message asking pt to call back regarding discharge instructions and/or medications. Advised pt of appt w/ Dr. Yvone Neu on 07/30/16 at 2:00 w/ [facility]. Asked pt to call back if unable to keep this appt.

## 2016-07-23 NOTE — Discharge Instructions (Signed)
Heart healthy diet. Holter monitor Recommended no driving Until he is been cleared by cardiology and neurology
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
## 2016-07-27 ENCOUNTER — Telehealth: Payer: Self-pay | Admitting: Cardiology

## 2016-07-27 DIAGNOSIS — R55 Syncope and collapse: Secondary | ICD-10-CM

## 2016-07-27 NOTE — Telephone Encounter (Signed)
Patient registered on [employer] website.  Called patient and left message to let him know that monitor will be on its way in 5-7 days and to call back if he has any questions.

## 2016-07-27 NOTE — Telephone Encounter (Signed)
I have not seen this patient. We should follow recommendations made by Cardiology when pt was seen in the hospital.
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
## 2016-07-27 NOTE — Telephone Encounter (Signed)
Pt calling stating he has not yet received the monitor in mail  Would like a update on that Please call

## 2016-07-28 NOTE — Telephone Encounter (Signed)
Called and spoke with patient.  He said he had received the message. Went over the basics of the [employer] monitor instructions over the phone. Patient verbalized understanding and will call the company for assistance with applying the monitor if needed.

## 2016-07-30 ENCOUNTER — Telehealth: Payer: Self-pay | Admitting: *Deleted

## 2016-07-30 ENCOUNTER — Encounter: Payer: Medicare Other | Admitting: Cardiology

## 2016-07-30 ENCOUNTER — Encounter: Payer: Self-pay | Admitting: *Deleted

## 2016-07-30 NOTE — Telephone Encounter (Signed)
I spoke with pt today to confirm any prior cardiologist/cardiac hx.  Pt mentioned that he has not received his monitor as of now. He was told that he was suppose to receive monitor by Friday.  He would like to know if he needs to reschedule his appointment or should he keep his appointment due to him not receiving monitor and would like to know status of monitor. Please advise.

## 2016-07-30 NOTE — Telephone Encounter (Signed)
Returned call to patient. Let him know it will take 5-7 business days to receive the monitor. Monitor ordered with [employer] 2 days ago so it may take a few more days. Patient aware of upcoming appt with Dr Yvone Neu on 08/06/16 at 1140. Am.

## 2016-08-02 ENCOUNTER — Ambulatory Visit (INDEPENDENT_AMBULATORY_CARE_PROVIDER_SITE_OTHER): Payer: Medicare Other

## 2016-08-02 DIAGNOSIS — R55 Syncope and collapse: Secondary | ICD-10-CM

## 2016-08-06 ENCOUNTER — Ambulatory Visit (INDEPENDENT_AMBULATORY_CARE_PROVIDER_SITE_OTHER): Payer: Medicare Other | Admitting: Cardiology

## 2016-08-06 ENCOUNTER — Encounter: Payer: Self-pay | Admitting: Cardiology

## 2016-08-06 VITALS — BP 120/80 | HR 106 | Ht 70.0 in | Wt 196.2 lb

## 2016-08-06 DIAGNOSIS — R55 Syncope and collapse: Secondary | ICD-10-CM | POA: Diagnosis not present

## 2016-08-06 DIAGNOSIS — I951 Orthostatic hypotension: Secondary | ICD-10-CM | POA: Diagnosis not present

## 2016-08-06 NOTE — Patient Instructions (Addendum)
Please talk to your physician sabout possible drug interactions: Effexor and celexa Effexor and Cymbalta Celexa and  Omeprazole Norco and Effexor Norco and Celexa Norco and Neurontin  Follow Up: Your physician recommends that you schedule a follow-up appointment after monitor results.  You have been referred to Pulmonary for possible sleep study.   It was a pleasure seeing you today here in the office. Please do not hesitate to give [place] a call back if you have any further questions. [place], BSN
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
## 2016-09-08 ENCOUNTER — Other Ambulatory Visit: Payer: Self-pay | Admitting: *Deleted

## 2016-09-08 ENCOUNTER — Institutional Professional Consult (permissible substitution): Payer: Medicare Other | Admitting: Internal Medicine

## 2016-09-08 DIAGNOSIS — R55 Syncope and collapse: Secondary | ICD-10-CM

## 2016-09-22 ENCOUNTER — Institutional Professional Consult (permissible substitution): Payer: Medicare Other | Admitting: Internal Medicine

## 2016-11-16 ENCOUNTER — Emergency Department
Admission: EM | Admit: 2016-11-16 | Discharge: 2016-11-16 | Disposition: A | Discharge status: Home / Self Care | Payer: Medicare Other | Attending: Emergency Medicine | Admitting: Emergency Medicine

## 2016-11-16 DIAGNOSIS — Z79899 Other long term (current) drug therapy: Secondary | ICD-10-CM | POA: Insufficient documentation

## 2016-11-16 DIAGNOSIS — Z87891 Personal history of nicotine dependence: Secondary | ICD-10-CM | POA: Diagnosis not present

## 2016-11-16 DIAGNOSIS — L02411 Cutaneous abscess of right axilla: Secondary | ICD-10-CM | POA: Insufficient documentation

## 2016-11-16 DIAGNOSIS — Z7982 Long term (current) use of aspirin: Secondary | ICD-10-CM | POA: Diagnosis not present

## 2016-11-16 MED ORDER — CLINDAMYCIN HCL 150 MG PO CAPS: 150.0000 mg | ORAL_CAPSULE | Freq: Four times a day (QID) | ORAL | 0 refills | Status: DC

## 2016-11-16 MED ORDER — OXYCODONE-ACETAMINOPHEN 5-325 MG PO TABS: 1.0000 | ORAL_TABLET | Freq: Four times a day (QID) | ORAL | 0 refills | Status: DC | PRN
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
## 2016-11-16 NOTE — ED Triage Notes (Signed)
Pt c/o abscess of the right axillary a week ago Saturday.

## 2016-11-16 NOTE — ED Notes (Signed)
See triage note   States he developed a possible abscess area under right arm several days ago

## 2016-11-18 ENCOUNTER — Emergency Department: Payer: Medicare Other

## 2016-11-18 ENCOUNTER — Emergency Department
Admission: EM | Admit: 2016-11-18 | Discharge: 2016-11-18 | Disposition: A | Discharge status: Home / Self Care | Payer: Medicare Other | Attending: Emergency Medicine | Admitting: Emergency Medicine

## 2016-11-18 ENCOUNTER — Encounter: Payer: Self-pay | Admitting: Emergency Medicine

## 2016-11-18 DIAGNOSIS — R0789 Other chest pain: Secondary | ICD-10-CM | POA: Diagnosis not present

## 2016-11-18 DIAGNOSIS — Z91040 Latex allergy status: Secondary | ICD-10-CM | POA: Diagnosis not present

## 2016-11-18 DIAGNOSIS — Z79899 Other long term (current) drug therapy: Secondary | ICD-10-CM | POA: Insufficient documentation

## 2016-11-18 DIAGNOSIS — B029 Zoster without complications: Secondary | ICD-10-CM | POA: Insufficient documentation

## 2016-11-18 DIAGNOSIS — Z7982 Long term (current) use of aspirin: Secondary | ICD-10-CM | POA: Diagnosis not present

## 2016-11-18 DIAGNOSIS — M79621 Pain in right upper arm: Secondary | ICD-10-CM | POA: Diagnosis not present

## 2016-11-18 DIAGNOSIS — Z87891 Personal history of nicotine dependence: Secondary | ICD-10-CM | POA: Insufficient documentation

## 2016-11-18 LAB — CBC WITH DIFFERENTIAL/PLATELET
BASOS PCT: 1 %
Basophils Absolute: 0.1 10*3/uL (ref 0–0.1)
Eosinophils Absolute: 0.2 10*3/uL (ref 0–0.7)
Eosinophils Relative: 3 %
HCT: 39.3 % — ABNORMAL LOW (ref 40.0–52.0)
HEMOGLOBIN: 13.8 g/dL (ref 13.0–18.0)
LYMPHS ABS: 2 10*3/uL (ref 1.0–3.6)
Lymphocytes Relative: 30 %
MCH: 31.9 pg (ref 26.0–34.0)
MCHC: 35.1 g/dL (ref 32.0–36.0)
MCV: 90.7 fL (ref 80.0–100.0)
MONOS PCT: 9 %
Monocytes Absolute: 0.6 10*3/uL (ref 0.2–1.0)
NEUTROS ABS: 3.8 10*3/uL (ref 1.4–6.5)
NEUTROS PCT: 57 %
Platelets: 234 10*3/uL (ref 150–440)
RBC: 4.34 MIL/uL — AB (ref 4.40–5.90)
RDW: 13.2 % (ref 11.5–14.5)
WBC: 6.7 10*3/uL (ref 3.8–10.6)

## 2016-11-18 LAB — COMPREHENSIVE METABOLIC PANEL
ALT: 31 U/L (ref 17–63)
ANION GAP: 9 (ref 5–15)
AST: 29 U/L (ref 15–41)
Albumin: 4.2 g/dL (ref 3.5–5.0)
Alkaline Phosphatase: 89 U/L (ref 38–126)
BUN: 13 mg/dL (ref 6–20)
CALCIUM: 9.1 mg/dL (ref 8.9–10.3)
CHLORIDE: 102 mmol/L (ref 101–111)
CO2: 26 mmol/L (ref 22–32)
Creatinine, Ser: 1.1 mg/dL (ref 0.61–1.24)
Glucose, Bld: 112 mg/dL — ABNORMAL HIGH (ref 65–99)
Potassium: 4.3 mmol/L (ref 3.5–5.1)
SODIUM: 137 mmol/L (ref 135–145)
Total Bilirubin: 0.5 mg/dL (ref 0.3–1.2)
Total Protein: 7.2 g/dL (ref 6.5–8.1)

## 2016-11-18 MED ORDER — ACYCLOVIR 400 MG PO TABS: 400.0000 mg | ORAL_TABLET | Freq: Every day | ORAL | 0 refills | Status: AC

## 2016-11-18 MED ORDER — TRAMADOL HCL 50 MG PO TABS: 50.0000 mg | ORAL_TABLET | Freq: Four times a day (QID) | ORAL | 0 refills | Status: DC | PRN

## 2016-11-18 MED ORDER — IOPAMIDOL (ISOVUE-300) INJECTION 61%
75.0000 mL | Freq: Once | INTRAVENOUS | Status: AC | PRN
  Administered 2016-11-18: 75 mL via INTRAVENOUS
  Filled 2016-11-18: qty 75
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
## 2016-11-18 NOTE — ED Triage Notes (Signed)
Presents with increased pain to right axilla  No redness noted  But states pain is moving to back  Tenderness noted to posterior shoulder area

## 2016-11-18 NOTE — Discharge Instructions (Signed)
Take medication as prescribed. Return to emergency department if symptoms worsen and follow-up with PCP as needed.   °

## 2017-02-14 ENCOUNTER — Emergency Department
Admission: EM | Admit: 2017-02-14 | Discharge: 2017-02-14 | Disposition: A | Discharge status: Home / Self Care | Payer: Medicare Other | Attending: Emergency Medicine | Admitting: Emergency Medicine

## 2017-02-14 ENCOUNTER — Emergency Department: Payer: Medicare Other

## 2017-02-14 ENCOUNTER — Encounter: Payer: Self-pay | Admitting: Emergency Medicine

## 2017-02-14 DIAGNOSIS — Z91040 Latex allergy status: Secondary | ICD-10-CM | POA: Diagnosis not present

## 2017-02-14 DIAGNOSIS — Z87891 Personal history of nicotine dependence: Secondary | ICD-10-CM | POA: Insufficient documentation

## 2017-02-14 DIAGNOSIS — S8012XA Contusion of left lower leg, initial encounter: Secondary | ICD-10-CM | POA: Insufficient documentation

## 2017-02-14 DIAGNOSIS — Z7982 Long term (current) use of aspirin: Secondary | ICD-10-CM | POA: Insufficient documentation

## 2017-02-14 DIAGNOSIS — Y929 Unspecified place or not applicable: Secondary | ICD-10-CM | POA: Insufficient documentation

## 2017-02-14 DIAGNOSIS — M7989 Other specified soft tissue disorders: Secondary | ICD-10-CM

## 2017-02-14 DIAGNOSIS — Z79899 Other long term (current) drug therapy: Secondary | ICD-10-CM | POA: Insufficient documentation

## 2017-02-14 DIAGNOSIS — S8992XA Unspecified injury of left lower leg, initial encounter: Secondary | ICD-10-CM | POA: Diagnosis present

## 2017-02-14 DIAGNOSIS — Y998 Other external cause status: Secondary | ICD-10-CM | POA: Diagnosis not present

## 2017-02-14 DIAGNOSIS — R2242 Localized swelling, mass and lump, left lower limb: Secondary | ICD-10-CM | POA: Diagnosis not present

## 2017-02-14 DIAGNOSIS — Y9389 Activity, other specified: Secondary | ICD-10-CM | POA: Diagnosis not present

## 2017-02-14 DIAGNOSIS — M79662 Pain in left lower leg: Secondary | ICD-10-CM

## 2017-02-14 DIAGNOSIS — F319 Bipolar disorder, unspecified: Secondary | ICD-10-CM | POA: Diagnosis not present

## 2017-02-14 MED ORDER — OXYCODONE-ACETAMINOPHEN 5-325 MG PO TABS: 1.0000 | ORAL_TABLET | Freq: Four times a day (QID) | ORAL | 0 refills | Status: DC | PRN

## 2017-02-14 MED ORDER — OXYCODONE-ACETAMINOPHEN 5-325 MG PO TABS
1.0000 | ORAL_TABLET | Freq: Once | ORAL | Status: AC
  Administered 2017-02-14: 1 via ORAL
  Filled 2017-02-14: qty 1

## 2017-02-14 MED ORDER — IBUPROFEN 800 MG PO TABS: 800.0000 mg | ORAL_TABLET | Freq: Three times a day (TID) | ORAL | 0 refills | Status: DC | PRN

## 2017-02-14 NOTE — Discharge Instructions (Signed)
Take medication as prescribed.   Return to emergency department if symptoms worsen and follow-up with PCP as needed.    Continue using crutches for walking until symptoms improve.
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
## 2017-02-14 NOTE — ED Notes (Signed)
Verbal order from Traci to give pt 1 tablet percocet; pt says he can take without problems and he can call his son for a ride home
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
## 2017-02-14 NOTE — ED Notes (Signed)
Pt requesting CT, provider notified

## 2017-07-19 ENCOUNTER — Emergency Department: Payer: Medicare Other

## 2017-07-19 ENCOUNTER — Encounter: Payer: Self-pay | Admitting: Emergency Medicine

## 2017-07-19 ENCOUNTER — Other Ambulatory Visit: Payer: Self-pay

## 2017-07-19 ENCOUNTER — Emergency Department
Admission: EM | Admit: 2017-07-19 | Discharge: 2017-07-19 | Disposition: A | Discharge status: Home / Self Care | Payer: Medicare Other | Attending: Emergency Medicine | Admitting: Emergency Medicine

## 2017-07-19 DIAGNOSIS — M25521 Pain in right elbow: Secondary | ICD-10-CM | POA: Insufficient documentation

## 2017-07-19 DIAGNOSIS — Z79899 Other long term (current) drug therapy: Secondary | ICD-10-CM | POA: Insufficient documentation

## 2017-07-19 DIAGNOSIS — M79601 Pain in right arm: Secondary | ICD-10-CM | POA: Diagnosis present

## 2017-07-19 DIAGNOSIS — Z7982 Long term (current) use of aspirin: Secondary | ICD-10-CM | POA: Insufficient documentation

## 2017-07-19 DIAGNOSIS — Z87891 Personal history of nicotine dependence: Secondary | ICD-10-CM | POA: Insufficient documentation

## 2017-07-19 DIAGNOSIS — M5412 Radiculopathy, cervical region: Secondary | ICD-10-CM | POA: Insufficient documentation

## 2017-07-19 MED ORDER — LIDOCAINE 5 % EX PTCH
1.0000 | MEDICATED_PATCH | CUTANEOUS | Status: DC
Start: 2017-07-19 — End: 2017-07-19
  Administered 2017-07-19: 1 via TRANSDERMAL
  Filled 2017-07-19: qty 1

## 2017-07-19 MED ORDER — ACETAMINOPHEN 500 MG PO TABS: 1000.0000 mg | ORAL_TABLET | Freq: Once | ORAL | Status: DC

## 2017-07-19 MED ORDER — PREDNISONE 10 MG (21) PO TBPK: ORAL_TABLET | ORAL | 0 refills | Status: DC

## 2017-07-19 MED ORDER — TRAMADOL HCL 50 MG PO TABS: 50.0000 mg | ORAL_TABLET | Freq: Four times a day (QID) | ORAL | 0 refills | Status: DC | PRN

## 2017-07-19 MED ORDER — METHYLPREDNISOLONE SODIUM SUCC 125 MG IJ SOLR
125.0000 mg | Freq: Once | INTRAMUSCULAR | Status: AC
  Administered 2017-07-19: 125 mg via INTRAVENOUS
  Filled 2017-07-19: qty 2

## 2017-07-19 NOTE — Discharge Instructions (Signed)
Please follow up with orthopedic surgery. °

## 2017-07-19 NOTE — ED Triage Notes (Addendum)
Pt c/o right elbow pain x 2 weeks after hitting arm on "a piece of oak"; some bruising and swelling noted to area; pt says he tried to "man up" but was unable to sleep tonight due to pain; full ROM but with some increased pain; pt says the pain now radiates up to his neck; pt with flat affect noted in triage

## 2017-07-19 NOTE — ED Notes (Signed)
Pt declining xray in triage; would like to speak to MD first as he thinks he has nerve damage and an xray won't show that;

## 2017-07-19 NOTE — ED Notes (Signed)
Patient transported to X-ray 

## 2017-07-19 NOTE — ED Notes (Signed)
Pt attempting to leave. Redirected back into room to review discharge paperwork. Pt refused to have vitals retaken, stating "I just want to leave"
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
## 2017-08-18 ENCOUNTER — Emergency Department: Payer: Medicare Other

## 2017-08-18 ENCOUNTER — Encounter: Payer: Self-pay | Admitting: Emergency Medicine

## 2017-08-18 ENCOUNTER — Other Ambulatory Visit: Payer: Self-pay

## 2017-08-18 ENCOUNTER — Emergency Department
Admission: EM | Admit: 2017-08-18 | Discharge: 2017-08-18 | Disposition: A | Discharge status: Home / Self Care | Payer: Medicare Other | Attending: Emergency Medicine | Admitting: Emergency Medicine

## 2017-08-18 DIAGNOSIS — Z5321 Procedure and treatment not carried out due to patient leaving prior to being seen by health care provider: Secondary | ICD-10-CM | POA: Diagnosis not present

## 2017-08-18 DIAGNOSIS — R079 Chest pain, unspecified: Secondary | ICD-10-CM | POA: Diagnosis not present

## 2017-08-18 LAB — CBC
HEMATOCRIT: 40.6 % (ref 40.0–52.0)
HEMOGLOBIN: 14.1 g/dL (ref 13.0–18.0)
MCH: 31.8 pg (ref 26.0–34.0)
MCHC: 34.6 g/dL (ref 32.0–36.0)
MCV: 91.8 fL (ref 80.0–100.0)
Platelets: 276 10*3/uL (ref 150–440)
RBC: 4.42 MIL/uL (ref 4.40–5.90)
RDW: 13.9 % (ref 11.5–14.5)
WBC: 9.7 10*3/uL (ref 3.8–10.6)

## 2017-08-18 LAB — BASIC METABOLIC PANEL
ANION GAP: 7 (ref 5–15)
BUN: 22 mg/dL — AB (ref 6–20)
CALCIUM: 9.2 mg/dL (ref 8.9–10.3)
CO2: 28 mmol/L (ref 22–32)
Chloride: 101 mmol/L (ref 101–111)
Creatinine, Ser: 1.15 mg/dL (ref 0.61–1.24)
GFR calc Af Amer: >60 mL/min (ref >60)
Glucose, Bld: 89 mg/dL (ref 65–99)
POTASSIUM: 4.4 mmol/L (ref 3.5–5.1)
Sodium: 136 mmol/L (ref 135–145)

## 2017-08-18 LAB — TROPONIN I: Troponin I: <0.03 ng/mL (ref <0.03)

## 2017-08-18 NOTE — ED Triage Notes (Signed)
Pt presents to ED via POV with c/o substernal chest pain that started last night. Pt states was D/C yesterday from the hospital after having neck surgery on C4, C5, C6. Pt also c/o HA. Pt denies radiation at this time.

## 2017-08-18 NOTE — ED Notes (Signed)
Pt asking how much longer he has to wait. Advised patient that it was difficult to determine the length of time however we would get him back as soon as possible. Advised patient that he would be one of the next few to go back when rooms came open unless a dire emergency came in. Pt states "I said I had chest pain, so I should not be in the waiting room at all." "I am not going to sit here and not be cared for". "I will go to another hospital". This RN advised patient that his care has already began. Patient advised that he had an EKG completed, the doctor reviewed it and we have drawn labs to start his care. Also advised patient that we would continue to monitor for results and add additional tests or procedures as necessary to ensure continued care. Pt advised to stay and be seen. Pt refusing and got up from wheelchair.

## 2017-08-19 ENCOUNTER — Telehealth: Payer: Self-pay | Admitting: Emergency Medicine

## 2017-08-19 NOTE — Telephone Encounter (Signed)
Called patient due to lwot to inquire about condition and follow up plans.  Patient says he has not had any follow up .   I told him that I would recommend he be seen by a doctor for the chest pain.  He said he di not going to his pcp.   He said he is not coming back here.  He said he may go to [place].  I explained that they could see the lab results from here.

## 2017-09-14 IMAGING — CR DG FOREARM 2V*R*
1 series · 2 of 2 positions shown · non-contrast
Comparison: None.

CLINICAL DATA: Arm pain for 4 days, recent injury.

EXAM:
RIGHT FOREARM - 2 VIEW

[Series 1: dg forearm right · 0.14mm/px · 2 of 2 slices shown]
[im 1/2]
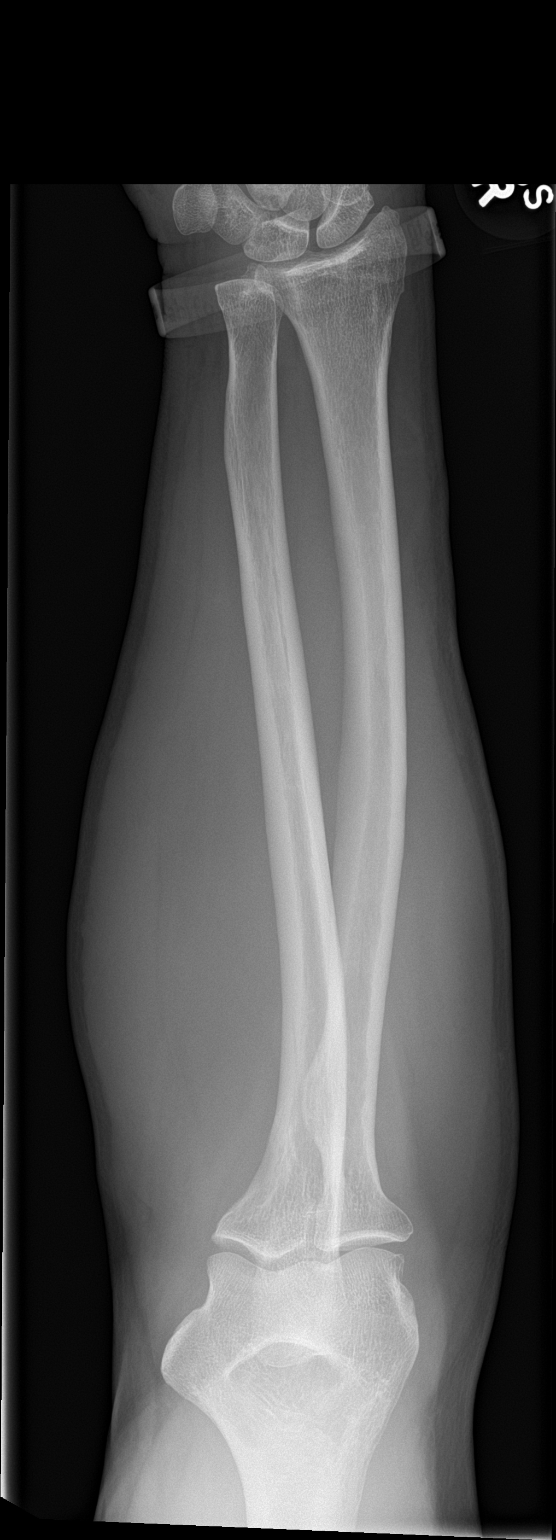
[im 2/2]
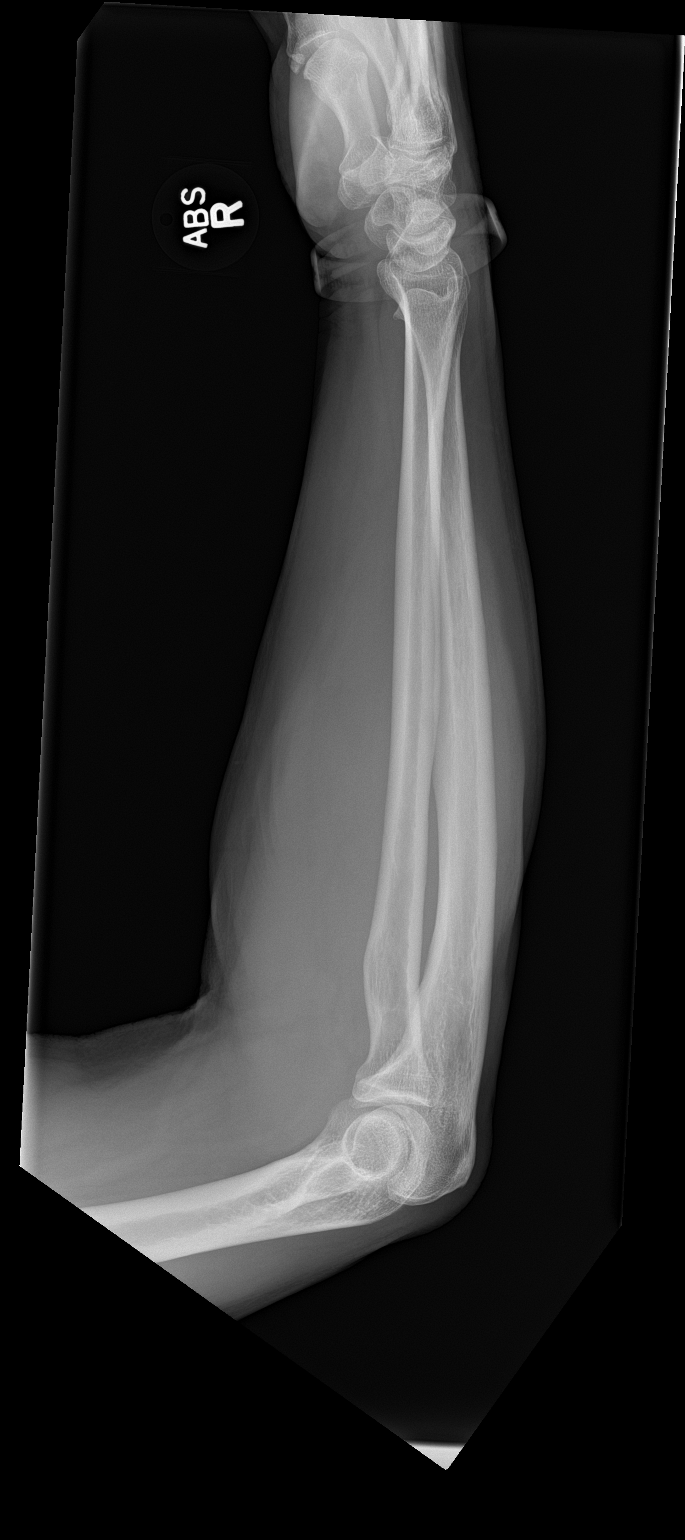

[2 of 2 positions shown; findings below may reference images not displayed]

FINDINGS: There is no evidence of fracture or other focal bone lesions. Soft
tissues are unremarkable.
IMPRESSION: Negative.

## 2017-09-23 ENCOUNTER — Encounter: Payer: Self-pay | Admitting: Student

## 2017-09-24 ENCOUNTER — Ambulatory Visit: Payer: Medicare Other | Admitting: Certified Registered Nurse Anesthetist

## 2017-09-24 ENCOUNTER — Ambulatory Visit
Admission: RE | Admit: 2017-09-24 | Discharge: 2017-09-24 | Disposition: A | Discharge status: Home / Self Care | Payer: Medicare Other | Source: Ambulatory Visit | Attending: Gastroenterology | Admitting: Gastroenterology

## 2017-09-24 ENCOUNTER — Encounter
Admission: RE | Disposition: A | Discharge status: Home / Self Care | Payer: Self-pay | Source: Ambulatory Visit | Attending: Gastroenterology

## 2017-09-24 ENCOUNTER — Encounter: Payer: Self-pay | Admitting: Certified Registered Nurse Anesthetist

## 2017-09-24 DIAGNOSIS — R194 Change in bowel habit: Secondary | ICD-10-CM | POA: Diagnosis not present

## 2017-09-24 DIAGNOSIS — Z885 Allergy status to narcotic agent status: Secondary | ICD-10-CM | POA: Insufficient documentation

## 2017-09-24 DIAGNOSIS — Z880 Allergy status to penicillin: Secondary | ICD-10-CM | POA: Insufficient documentation

## 2017-09-24 DIAGNOSIS — Z79899 Other long term (current) drug therapy: Secondary | ICD-10-CM | POA: Insufficient documentation

## 2017-09-24 DIAGNOSIS — D123 Benign neoplasm of transverse colon: Secondary | ICD-10-CM | POA: Diagnosis not present

## 2017-09-24 DIAGNOSIS — Z91040 Latex allergy status: Secondary | ICD-10-CM | POA: Diagnosis not present

## 2017-09-24 DIAGNOSIS — K219 Gastro-esophageal reflux disease without esophagitis: Secondary | ICD-10-CM | POA: Insufficient documentation

## 2017-09-24 DIAGNOSIS — K621 Rectal polyp: Secondary | ICD-10-CM | POA: Insufficient documentation

## 2017-09-24 DIAGNOSIS — R197 Diarrhea, unspecified: Secondary | ICD-10-CM | POA: Diagnosis present

## 2017-09-24 DIAGNOSIS — Z881 Allergy status to other antibiotic agents status: Secondary | ICD-10-CM | POA: Diagnosis not present

## 2017-09-24 DIAGNOSIS — K5730 Diverticulosis of large intestine without perforation or abscess without bleeding: Secondary | ICD-10-CM | POA: Diagnosis not present

## 2017-09-24 HISTORY — PX: COLONOSCOPY WITH PROPOFOL: SHX5780

## 2017-09-24 HISTORY — DX: Dorsalgia, unspecified: M54.9

## 2017-09-24 HISTORY — DX: Tremor, unspecified: R25.1

## 2017-09-24 HISTORY — DX: Unspecified osteoarthritis, unspecified site: M19.90

## 2017-09-24 HISTORY — DX: Sleep apnea, unspecified: G47.30

## 2017-09-24 SURGERY — COLONOSCOPY WITH PROPOFOL
Anesthesia: General

## 2017-09-24 MED ORDER — SODIUM CHLORIDE 0.9 % IV SOLN
INTRAVENOUS | Status: DC
  Administered 2017-09-24: 1000 mL via INTRAVENOUS

## 2017-09-24 MED ORDER — PROPOFOL 10 MG/ML IV BOLUS
INTRAVENOUS | Status: DC | PRN
  Administered 2017-09-24: 40 mg via INTRAVENOUS
  Administered 2017-09-24: 30 mg via INTRAVENOUS

## 2017-09-24 MED ORDER — LIDOCAINE HCL (CARDIAC) PF 100 MG/5ML IV SOSY
PREFILLED_SYRINGE | INTRAVENOUS | Status: DC | PRN
  Administered 2017-09-24: 50 mg via INTRAVENOUS

## 2017-09-24 MED ORDER — LIDOCAINE HCL (PF) 1 % IJ SOLN
2.0000 mL | Freq: Once | INTRAMUSCULAR | Status: AC
  Administered 2017-09-24: 0.3 mL via INTRADERMAL

## 2017-09-24 MED ORDER — SODIUM CHLORIDE 0.9 % IV SOLN
INTRAVENOUS | Status: DC
  Administered 2017-09-24: 12:00:00 via INTRAVENOUS

## 2017-09-24 MED ORDER — LIDOCAINE HCL (PF) 1 % IJ SOLN
INTRAMUSCULAR | Status: AC
  Administered 2017-09-24: 0.3 mL via INTRADERMAL
  Filled 2017-09-24: qty 2

## 2017-09-24 MED ORDER — PROPOFOL 10 MG/ML IV BOLUS
INTRAVENOUS | Status: AC
  Filled 2017-09-24: qty 20

## 2017-09-24 MED ORDER — PROPOFOL 500 MG/50ML IV EMUL
INTRAVENOUS | Status: AC
  Filled 2017-09-24: qty 50

## 2017-09-24 MED ORDER — PROPOFOL 500 MG/50ML IV EMUL
INTRAVENOUS | Status: DC | PRN
  Administered 2017-09-24: 90 ug/kg/min via INTRAVENOUS

## 2017-09-24 NOTE — Anesthesia Preprocedure Evaluation (Signed)
Anesthesia Evaluation  Patient identified by MRN, date of birth, ID band Patient awake    Reviewed: Allergy & Precautions, H&P , NPO status , Patient's Chart, lab work & pertinent test results, reviewed documented beta blocker date and time   History of Anesthesia Complications Negative for: history of anesthetic complications  Airway Mallampati: II  TM Distance: >3 FB Neck ROM: full    Dental  (+) Chipped, Dental Advidsory Given, Teeth Intact   Pulmonary neg shortness of breath, sleep apnea , neg COPD, neg recent URI, former smoker,           Cardiovascular Exercise Tolerance: Good negative cardio ROS       Neuro/Psych PSYCHIATRIC DISORDERS Depression Bipolar Disorder negative neurological ROS     GI/Hepatic Neg liver ROS, GERD  ,  Endo/Other  negative endocrine ROS  Renal/GU negative Renal ROS  negative genitourinary   Musculoskeletal   Abdominal   Peds  Hematology negative hematology ROS (+)   Anesthesia Other Findings Past Medical History: No date: Arrhythmia No date: Arthritis No date: Back pain No date: Bipolar affect, depressed ([facility]) No date: GERD (gastroesophageal reflux disease) No date: Hearing loss No date: Neuropathy No date: Sleep apnea No date: Tremor   Reproductive/Obstetrics negative OB ROS                             Anesthesia Physical Anesthesia Plan  ASA: II  Anesthesia Plan: General   Post-op Pain Management:    Induction: Intravenous  PONV Risk Score and Plan: 2 and Propofol infusion  Airway Management Planned: Nasal Cannula  Additional Equipment:   Intra-op Plan:   Post-operative Plan:   Informed Consent: I have reviewed the patients History and Physical, chart, labs and discussed the procedure including the risks, benefits and alternatives for the proposed anesthesia with the patient or authorized representative who has indicated his/her  understanding and acceptance.   Dental Advisory Given  Plan Discussed with: Anesthesiologist, CRNA and Surgeon  Anesthesia Plan Comments:         Anesthesia Quick Evaluation

## 2017-09-24 NOTE — H&P (Signed)
Outpatient short stay form Pre-procedure 09/24/2017 12:03 PM Jake Sails MD  Primary Physician: Dr Johny Drilling  Reason for visit: Colonoscopy  History of present illness: Patient is a 62 year old male presenting today with a complaint of changes in bowel habits.  He states he had a hemorrhoidal banding about 10 months ago and since that time he has had issues with some fecal incontinence and more loose stool.  Has had stool cultures and PCR testing that was negative.  There is a questionable history of colon polyps.  His last colonoscopy was about 9 years ago.  Patient tolerated his prep well.  He takes no aspirin or blood thinner with the exception of 81 mg.    Current Facility-Administered Medications:  .  0.9 %  sodium chloride infusion, , Intravenous, Continuous, Jake Sails, MD .  0.9 %  sodium chloride infusion, , Intravenous, Continuous, Jake Sails, MD .  lidocaine (PF) (XYLOCAINE) 1 % injection 2 mL, 2 mL, Intradermal, Once, Jake Sails, MD .  lidocaine (PF) (XYLOCAINE) 1 % injection, , , ,   Medications Prior to Admission  Medication Sig Dispense Refill Last Dose  . aspirin 81 MG tablet Take 1 tablet (81 mg total) by mouth daily. 30 tablet 2 Past Month at Unknown time  . clotrimazole-betamethasone (LOTRISONE) cream Apply 1 application topically 2 (two) times daily.     . DULoxetine (CYMBALTA) 60 MG capsule Take 60 mg by mouth daily.     Marland Kitchen tiZANidine (ZANAFLEX) 4 MG tablet Take 4 mg by mouth every 6 (six) hours as needed for muscle spasms.     Marland Kitchen venlafaxine (EFFEXOR) 75 MG tablet Take 75 mg by mouth 2 (two) times daily.     . vitamin B-12 (CYANOCOBALAMIN) 1000 MCG tablet Take 1,000 mcg by mouth daily.     Marland Kitchen ALPRAZolam (XANAX) 1 MG tablet Take 1 mg by mouth 2 (two) times daily as needed.    Taking  . citalopram (CELEXA) 20 MG tablet Take 1 tablet by mouth daily. Take 20 mg by mouth daily.   Taking  . gabapentin (NEURONTIN) 400 MG capsule Take 1,200 mg  by mouth 2 (two) times daily.    Taking  . lamoTRIgine (LAMICTAL) 100 MG tablet Take 150 mg by mouth daily.    Taking  . omeprazole (PRILOSEC) 40 MG capsule Take 40 mg by mouth daily.   Taking  . predniSONE (STERAPRED UNI-PAK 21 TAB) 10 MG (21) TBPK tablet Take 6 tabs on day 1 Take 5 tabs on day 2 Take 4 tabs on day 3 Take 3 tabs on day 4 Take 2 tabs on day 5 Take 1 tab on day 6 21 tablet 0   . ranitidine (ZANTAC) 150 MG capsule Take 150 mg by mouth every evening.    Taking  . traMADol (ULTRAM) 50 MG tablet Take 1 tablet (50 mg total) by mouth every 6 (six) hours as needed. 12 tablet 0      Allergies  Allergen Reactions  . Latex Shortness Of Breath    Peels skin off  . Penicillins Shortness Of Breath  . Codeine Itching  . Fentanyl   . Sulfa Antibiotics Itching     Past Medical History:  Diagnosis Date  . Arrhythmia   . Arthritis   . Back pain   . Bipolar affect, depressed ([place])   . GERD (gastroesophageal reflux disease)   . Hearing loss   . Neuropathy   . Sleep apnea   . Tremor  Review of systems:      Physical Exam    Heart and lungs: Rate and rhythm without rub or gallop, lungs are bilaterally clear.    HEENT: Normocephalic atraumatic eyes are anicteric    Other:    Pertinant exam for procedure: Soft nontender nondistended bowel sounds positive normoactive    Planned proceedures: Colonoscopy and indicated procedures. I have discussed the risks benefits and complications of procedures to include not limited to bleeding, infection, perforation and the risk of sedation and the patient wishes to proceed.    Jake Sails, MD Gastroenterology 09/24/2017  12:03 PM

## 2017-09-24 NOTE — Op Note (Signed)
[facility] Patient Name: Jake Richards Procedure Date: 09/24/2017 12:14 PM MRN: [id number] Account #: [id number] Date of Birth: [birth year] Admit Type: Outpatient Age: 62 Room: [facility] ENDO ROOM 3 Gender: Male Note Status: Finalized Procedure:            Colonoscopy Indications:          Clinically significant diarrhea of unexplained origin,                        Change in bowel habits Providers:            Lollie Sails, MD Referring MD:         No Local Md, MD (Referring MD) Medicines:            Monitored Anesthesia Care Complications:        No immediate complications. Procedure:            Pre-Anesthesia Assessment:                       - ASA Grade Assessment: II - A patient with mild                        systemic disease.                       After obtaining informed consent, the colonoscope was                        passed under direct vision. Throughout the procedure,                        the patient's blood pressure, pulse, and oxygen                        saturations were monitored continuously. The                        Colonoscope was introduced through the anus and                        advanced to the the cecum, identified by appendiceal                        orifice and ileocecal valve. The colonoscopy was                        unusually difficult due to poor bowel prep, significant                        looping and a tortuous colon. Successful completion of                        the procedure was aided by changing the patient to a                        supine position, changing the patient to a prone                        position, using manual pressure, changing endoscopes  and lavage. The quality of the bowel preparation was                        fair. Findings:      A few small-mouthed diverticula were found in the sigmoid colon and       distal descending colon.      A 2 mm polyp was  found in the transverse colon. The polyp was sessile.       The polyp was removed with a cold biopsy forceps. Resection and       retrieval were complete.      Two sessile polyps were found in the hepatic flexure. The polyps were 2       to 4 mm in size. These polyps were removed with a cold biopsy forceps.       Resection and retrieval were complete.      Biopsies for histology were taken with a cold forceps from the right       colon and left colon for evaluation of microscopic colitis.      Three sessile polyps were found in the rectum. The polyps were 1 to 2 mm       in size.      The digital rectal exam was normal. Not able to retroflex due to       narrowness of the rectal vault. Impression:           - Preparation of the colon was fair.                       - Diverticulosis in the sigmoid colon and in the distal                        descending colon.                       - One 2 mm polyp in the transverse colon, removed with                        a cold biopsy forceps. Resected and retrieved.                       - Two 2 to 4 mm polyps at the hepatic flexure, removed                        with a cold biopsy forceps. Resected and retrieved.                       - Three 1 to 2 mm polyps in the rectum.                       - Biopsies were taken with a cold forceps from the                        right colon and left colon for evaluation of                        microscopic colitis. Recommendation:       - Discharge patient to home.                       -  Discharge patient to home.                       - Soft diet today, then advance as tolerated to advance                        diet as tolerated.                       - Await pathology results.                       - Return to GI clinic in 1 month.                       - trial one dose of citrucel and one dose of immodium                        daily. Procedure Code(s):    --- Professional ---                        [phone], Colonoscopy, flexible; with biopsy, single or                        multiple Diagnosis Code(s):    --- Professional ---                       D12.3, Benign neoplasm of transverse colon (hepatic                        flexure or splenic flexure)                       K62.1, Rectal polyp                       R19.7, Diarrhea, unspecified                       R19.4, Change in bowel habit                       K57.30, Diverticulosis of large intestine without                        perforation or abscess without bleeding CPT copyright 2017 American Medical Association. All rights reserved. The codes documented in this report are preliminary and upon coder review may  be revised to meet current compliance requirements. Lollie Sails, MD 09/24/2017 1:21:41 PM This report has been signed electronically. Number of Addenda: 0 Note Initiated On: 09/24/2017 12:14 PM Scope Withdrawal Time: 0 hours 14 minutes 11 seconds  Total Procedure Duration: 0 hours 54 minutes 9 seconds       [facility]

## 2017-09-24 NOTE — Transfer of Care (Signed)
Immediate Anesthesia Transfer of Care Note  Patient: Jake Richards  Procedure(s) Performed: COLONOSCOPY WITH PROPOFOL (N/A )  Patient Location: PACU  Anesthesia Type:MAC  Level of Consciousness: awake, alert  and oriented  Airway & Oxygen Therapy: Patient Spontanous Breathing and Patient connected to nasal cannula oxygen  Post-op Assessment: Report given to RN and Post -op Vital signs reviewed and stable  Post vital signs: stable  Last Vitals:  Vitals Value Taken Time  BP 135/123 09/24/2017  1:17 PM  Temp 36.6 C 09/24/2017  1:17 PM  Pulse 69 09/24/2017  1:19 PM  Resp 15 09/24/2017  1:19 PM  SpO2 96 % 09/24/2017  1:19 PM  Vitals shown include unvalidated device data.  Last Pain:  Vitals:   09/24/17 1317  TempSrc: Tympanic  PainSc: Asleep         Complications: No apparent anesthesia complications

## 2017-09-24 NOTE — Anesthesia Post-op Follow-up Note (Signed)
Anesthesia QCDR form completed.        

## 2017-09-27 ENCOUNTER — Encounter: Payer: Self-pay | Admitting: Gastroenterology

## 2017-09-27 LAB — SURGICAL PATHOLOGY

## 2017-09-27 NOTE — Anesthesia Postprocedure Evaluation (Signed)
Anesthesia Post Note  Patient: Jake Richards  Procedure(s) Performed: COLONOSCOPY WITH PROPOFOL (N/A )  Patient location during evaluation: Endoscopy Anesthesia Type: General Level of consciousness: awake and alert Pain management: pain level controlled Vital Signs Assessment: post-procedure vital signs reviewed and stable Respiratory status: spontaneous breathing, nonlabored ventilation, respiratory function stable and patient connected to nasal cannula oxygen Cardiovascular status: blood pressure returned to baseline and stable Postop Assessment: no apparent nausea or vomiting Anesthetic complications: no     Last Vitals:  Vitals:   09/24/17 1327 09/24/17 1337  BP: 100/77 109/72  Pulse: 65 75  Resp: 14 14  Temp:    SpO2: 98% 98%    Last Pain:  Vitals:   09/24/17 1337  TempSrc:   PainSc: 0-No pain                 Martha Clan

## 2018-05-29 ENCOUNTER — Emergency Department
Admission: EM | Admit: 2018-05-29 | Discharge: 2018-05-29 | Disposition: A | Discharge status: Home / Self Care | Payer: Medicare Other | Attending: Emergency Medicine | Admitting: Emergency Medicine

## 2018-05-29 ENCOUNTER — Other Ambulatory Visit: Payer: Self-pay

## 2018-05-29 DIAGNOSIS — L089 Local infection of the skin and subcutaneous tissue, unspecified: Secondary | ICD-10-CM

## 2018-05-29 DIAGNOSIS — Z79899 Other long term (current) drug therapy: Secondary | ICD-10-CM | POA: Insufficient documentation

## 2018-05-29 DIAGNOSIS — T7840XA Allergy, unspecified, initial encounter: Secondary | ICD-10-CM | POA: Diagnosis not present

## 2018-05-29 DIAGNOSIS — Z87891 Personal history of nicotine dependence: Secondary | ICD-10-CM | POA: Diagnosis not present

## 2018-05-29 DIAGNOSIS — T148XXA Other injury of unspecified body region, initial encounter: Secondary | ICD-10-CM

## 2018-05-29 DIAGNOSIS — Z7982 Long term (current) use of aspirin: Secondary | ICD-10-CM | POA: Diagnosis not present

## 2018-05-29 DIAGNOSIS — L298 Other pruritus: Secondary | ICD-10-CM | POA: Diagnosis present

## 2018-05-29 MED ORDER — DEXAMETHASONE 10 MG/ML FOR PEDIATRIC ORAL USE
10.0000 mg | Freq: Once | INTRAMUSCULAR | Status: AC
  Administered 2018-05-29: 10 mg via ORAL

## 2018-05-29 MED ORDER — DOXYCYCLINE HYCLATE 100 MG PO CAPS: 100.0000 mg | ORAL_CAPSULE | Freq: Two times a day (BID) | ORAL | 0 refills | Status: AC

## 2018-05-29 MED ORDER — DOXYCYCLINE HYCLATE 100 MG PO TABS
100.0000 mg | ORAL_TABLET | Freq: Once | ORAL | Status: AC
  Administered 2018-05-29: 100 mg via ORAL
  Filled 2018-05-29: qty 1
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
## 2018-05-29 NOTE — Discharge Instructions (Signed)
As we discussed, I think it is most likely you are having some sort of allergic reaction to the tattoo rather than it being an active infection, but I recommend that you treat both possibilities just to be safe.  Take the full course of prescribed antibiotics according to the prescription instructions.  Try taking a daily cetirizine (Zyrtec) 10 mg tablet.  You can actually take it twice a day if needed for persistent itching, redness, and/or swelling.  Use Benadryl at night according to the label instructions to help you sleep.  Continue to use a thin coating of hydrocortisone ointment on the affected area of your tattoo.  Return to the emergency department if you develop new or worsening symptoms that concern you.

## 2018-05-29 NOTE — ED Triage Notes (Signed)
Patient reports got a tattoo on Thursday and states not area is red and "leaking".

## 2018-06-02 ENCOUNTER — Ambulatory Visit: Payer: Self-pay | Admitting: Urology

## 2018-07-17 ENCOUNTER — Encounter: Payer: Self-pay | Admitting: Emergency Medicine

## 2018-07-17 ENCOUNTER — Emergency Department
Admission: EM | Admit: 2018-07-17 | Discharge: 2018-07-17 | Disposition: A | Discharge status: Home / Self Care | Payer: Medicare Other | Attending: Emergency Medicine | Admitting: Emergency Medicine

## 2018-07-17 ENCOUNTER — Emergency Department: Payer: Medicare Other

## 2018-07-17 ENCOUNTER — Other Ambulatory Visit: Payer: Self-pay

## 2018-07-17 DIAGNOSIS — Z87891 Personal history of nicotine dependence: Secondary | ICD-10-CM | POA: Diagnosis not present

## 2018-07-17 DIAGNOSIS — Z91040 Latex allergy status: Secondary | ICD-10-CM | POA: Insufficient documentation

## 2018-07-17 DIAGNOSIS — Y929 Unspecified place or not applicable: Secondary | ICD-10-CM | POA: Insufficient documentation

## 2018-07-17 DIAGNOSIS — Y999 Unspecified external cause status: Secondary | ICD-10-CM | POA: Diagnosis not present

## 2018-07-17 DIAGNOSIS — S0031XA Abrasion of nose, initial encounter: Secondary | ICD-10-CM | POA: Insufficient documentation

## 2018-07-17 DIAGNOSIS — Z7982 Long term (current) use of aspirin: Secondary | ICD-10-CM | POA: Diagnosis not present

## 2018-07-17 DIAGNOSIS — S0993XA Unspecified injury of face, initial encounter: Secondary | ICD-10-CM

## 2018-07-17 DIAGNOSIS — Z79899 Other long term (current) drug therapy: Secondary | ICD-10-CM | POA: Insufficient documentation

## 2018-07-17 DIAGNOSIS — S0990XA Unspecified injury of head, initial encounter: Secondary | ICD-10-CM | POA: Diagnosis not present

## 2018-07-17 DIAGNOSIS — W228XXA Striking against or struck by other objects, initial encounter: Secondary | ICD-10-CM | POA: Diagnosis not present

## 2018-07-17 DIAGNOSIS — Y9389 Activity, other specified: Secondary | ICD-10-CM | POA: Diagnosis not present

## 2018-07-17 MED ORDER — ACETAMINOPHEN 500 MG PO TABS
1000.0000 mg | ORAL_TABLET | Freq: Once | ORAL | Status: AC
  Administered 2018-07-17: 1000 mg via ORAL
  Filled 2018-07-17: qty 2

## 2018-07-17 MED ORDER — OXYCODONE HCL 5 MG PO TABS
5.0000 mg | ORAL_TABLET | Freq: Once | ORAL | Status: AC
  Administered 2018-07-17: 5 mg via ORAL
  Filled 2018-07-17: qty 1

## 2018-07-17 NOTE — Discharge Instructions (Signed)
Take ibuprofen or tylenol for pain. Apply ice. Follow up with your doctor.  Return to the emergency room for dizziness or severe headache.
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
## 2018-07-17 NOTE — ED Triage Notes (Signed)
Pt arrives ambulatory to triage with c/o laceration to his nose from a crowbar. Pt is in NAD.

## 2018-07-17 NOTE — ED Triage Notes (Signed)
Patient states that he was using a crow bar and it came back and hit him in the nose. Patient with laceration to nose with bleeding controlled at this time.

## 2018-11-02 ENCOUNTER — Other Ambulatory Visit: Payer: Self-pay | Admitting: Podiatry

## 2018-11-02 DIAGNOSIS — S86311A Strain of muscle(s) and tendon(s) of peroneal muscle group at lower leg level, right leg, initial encounter: Secondary | ICD-10-CM

## 2018-11-14 ENCOUNTER — Ambulatory Visit
Admission: RE | Admit: 2018-11-14 | Discharge: 2018-11-14 | Disposition: A | Discharge status: Home / Self Care | Payer: Medicare Other | Source: Ambulatory Visit | Attending: Podiatry | Admitting: Podiatry

## 2018-11-14 ENCOUNTER — Other Ambulatory Visit: Payer: Self-pay

## 2018-11-14 DIAGNOSIS — S86311A Strain of muscle(s) and tendon(s) of peroneal muscle group at lower leg level, right leg, initial encounter: Secondary | ICD-10-CM | POA: Diagnosis not present

## 2018-12-05 ENCOUNTER — Emergency Department
Admission: EM | Admit: 2018-12-05 | Discharge: 2018-12-05 | Disposition: A | Discharge status: Home / Self Care | Payer: Medicare Other | Attending: Emergency Medicine | Admitting: Emergency Medicine

## 2018-12-05 ENCOUNTER — Encounter: Payer: Self-pay | Admitting: Emergency Medicine

## 2018-12-05 ENCOUNTER — Other Ambulatory Visit: Payer: Self-pay

## 2018-12-05 DIAGNOSIS — Y9389 Activity, other specified: Secondary | ICD-10-CM | POA: Diagnosis not present

## 2018-12-05 DIAGNOSIS — S61215A Laceration without foreign body of left ring finger without damage to nail, initial encounter: Secondary | ICD-10-CM | POA: Insufficient documentation

## 2018-12-05 DIAGNOSIS — Z7982 Long term (current) use of aspirin: Secondary | ICD-10-CM | POA: Insufficient documentation

## 2018-12-05 DIAGNOSIS — S60455A Superficial foreign body of left ring finger, initial encounter: Secondary | ICD-10-CM | POA: Diagnosis not present

## 2018-12-05 DIAGNOSIS — Y92009 Unspecified place in unspecified non-institutional (private) residence as the place of occurrence of the external cause: Secondary | ICD-10-CM | POA: Insufficient documentation

## 2018-12-05 DIAGNOSIS — Z87891 Personal history of nicotine dependence: Secondary | ICD-10-CM | POA: Diagnosis not present

## 2018-12-05 DIAGNOSIS — Z91040 Latex allergy status: Secondary | ICD-10-CM | POA: Diagnosis not present

## 2018-12-05 DIAGNOSIS — Y999 Unspecified external cause status: Secondary | ICD-10-CM | POA: Diagnosis not present

## 2018-12-05 DIAGNOSIS — Z79899 Other long term (current) drug therapy: Secondary | ICD-10-CM | POA: Insufficient documentation

## 2018-12-05 DIAGNOSIS — W260XXA Contact with knife, initial encounter: Secondary | ICD-10-CM | POA: Diagnosis not present

## 2018-12-05 DIAGNOSIS — S6992XA Unspecified injury of left wrist, hand and finger(s), initial encounter: Secondary | ICD-10-CM | POA: Diagnosis present

## 2018-12-05 MED ORDER — DOXYCYCLINE HYCLATE 100 MG PO TABS: 100.0000 mg | ORAL_TABLET | Freq: Two times a day (BID) | ORAL | 0 refills | Status: DC

## 2018-12-05 NOTE — ED Triage Notes (Addendum)
Pt was cutting a rope and injured ring finger on left hand. Needs ring cut off.  Urgent care was unable to cut ring off.  Pt has swelling distal to ring.  Pt reports he has a cut from knife under ring. Unable to visualize cut.

## 2018-12-05 NOTE — ED Notes (Signed)
See triage note  Presents with a ring on left ring finger  States he tried to removed it then finger became more swollen  Ice applied on arrival
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
## 2018-12-22 ENCOUNTER — Emergency Department
Admission: EM | Admit: 2018-12-22 | Discharge: 2018-12-22 | Disposition: A | Discharge status: Home / Self Care | Payer: Medicare Other | Attending: Student in an Organized Health Care Education/Training Program | Admitting: Student in an Organized Health Care Education/Training Program

## 2018-12-22 ENCOUNTER — Other Ambulatory Visit: Payer: Self-pay

## 2018-12-22 DIAGNOSIS — Z87891 Personal history of nicotine dependence: Secondary | ICD-10-CM | POA: Diagnosis not present

## 2018-12-22 DIAGNOSIS — X30XXXA Exposure to excessive natural heat, initial encounter: Secondary | ICD-10-CM | POA: Diagnosis not present

## 2018-12-22 DIAGNOSIS — T675XXA Heat exhaustion, unspecified, initial encounter: Secondary | ICD-10-CM | POA: Insufficient documentation

## 2018-12-22 DIAGNOSIS — Z79899 Other long term (current) drug therapy: Secondary | ICD-10-CM | POA: Insufficient documentation

## 2018-12-22 DIAGNOSIS — Z7982 Long term (current) use of aspirin: Secondary | ICD-10-CM | POA: Insufficient documentation

## 2018-12-22 LAB — CBC
HCT: 38.7 % — ABNORMAL LOW (ref 39.0–52.0)
Hemoglobin: 13.7 g/dL (ref 13.0–17.0)
MCH: 31.9 pg (ref 26.0–34.0)
MCHC: 35.4 g/dL (ref 30.0–36.0)
MCV: 90.2 fL (ref 80.0–100.0)
Platelets: 260 10*3/uL (ref 150–400)
RBC: 4.29 MIL/uL (ref 4.22–5.81)
RDW: 12.6 % (ref 11.5–15.5)
WBC: 9.6 10*3/uL (ref 4.0–10.5)
nRBC: 0 % (ref 0.0–0.2)

## 2018-12-22 LAB — BASIC METABOLIC PANEL
Anion gap: 9 (ref 5–15)
BUN: 24 mg/dL — ABNORMAL HIGH (ref 8–23)
CO2: 21 mmol/L — ABNORMAL LOW (ref 22–32)
Calcium: 9.1 mg/dL (ref 8.9–10.3)
Chloride: 106 mmol/L (ref 98–111)
Creatinine, Ser: 1.46 mg/dL — ABNORMAL HIGH (ref 0.61–1.24)
GFR calc Af Amer: 59 mL/min — ABNORMAL LOW (ref >60)
GFR calc non Af Amer: 51 mL/min — ABNORMAL LOW (ref >60)
Glucose, Bld: 72 mg/dL (ref 70–99)
Potassium: 4.2 mmol/L (ref 3.5–5.1)
Sodium: 136 mmol/L (ref 135–145)

## 2018-12-22 LAB — URINALYSIS, COMPLETE (UACMP) WITH MICROSCOPIC
Bacteria, UA: NONE SEEN
Bilirubin Urine: NEGATIVE
Glucose, UA: NEGATIVE mg/dL
Hgb urine dipstick: NEGATIVE
Ketones, ur: NEGATIVE mg/dL
Leukocytes,Ua: NEGATIVE
Nitrite: NEGATIVE
Protein, ur: NEGATIVE mg/dL
Specific Gravity, Urine: 1.008 (ref 1.005–1.030)
pH: 5 (ref 5.0–8.0)

## 2018-12-22 LAB — CK: Total CK: 396 U/L (ref 49–397)

## 2018-12-22 MED ORDER — SODIUM CHLORIDE 0.9 % IV BOLUS
500.0000 mL | Freq: Once | INTRAVENOUS | Status: AC
  Administered 2018-12-22: 17:00:00 500 mL via INTRAVENOUS
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
## 2018-12-22 NOTE — ED Triage Notes (Signed)
Patient presents to the ED via EMS for abdominal cramping and weakness that occurred after patient had been chopping wood for approx. 1.5 hours.  Patient was given 1025ml of fluid by EMS.  Patient states he is feeling better but is still slightly weak.  Patient drank pickle juice and water at home prior to arrival.  Patient is alert and oriented x 4.

## 2018-12-22 NOTE — ED Notes (Signed)
Nutrition offered and patient refused.  Patient states he is feeling much better and is ready to go home.  MD notified.  Will continue to monitor.

## 2018-12-26 ENCOUNTER — Emergency Department
Admission: EM | Admit: 2018-12-26 | Discharge: 2018-12-26 | Discharge status: Against Medical Advice | Payer: Medicare Other

## 2018-12-26 NOTE — ED Notes (Signed)
Pt requests SL to be removed and wants to leave without being seen or triaged; SO here to p/u pt; SL removed from left a/c, cannula intact, dsng in place

## 2019-08-04 ENCOUNTER — Ambulatory Visit: Payer: Medicare Other | Attending: Internal Medicine

## 2019-08-04 ENCOUNTER — Other Ambulatory Visit: Payer: Self-pay

## 2019-08-04 DIAGNOSIS — Z23 Encounter for immunization: Secondary | ICD-10-CM

## 2019-08-04 NOTE — Progress Notes (Signed)
   Covid-19 Vaccination Clinic  Name:  Jake Richards    MRN: [id number] DOB: [birth year]  08/04/2019  Mr. [name] was observed post Covid-19 immunization for 15 minutes without incident. He was provided with Vaccine Information Sheet and instruction to access the V-Safe system.   Mr. [name] was instructed to call 911 with any severe reactions post vaccine: Marland Kitchen Difficulty breathing  . Swelling of face and throat  . A fast heartbeat  . A bad rash all over body  . Dizziness and weakness   Immunizations Administered    Name Date Dose VIS Date Route   Pfizer COVID-19 Vaccine 08/04/2019 11:51 AM 0.3 mL 03/31/2019 Intramuscular   Manufacturer: [place]   Lot: [id number]   [place]: [id number]

## 2019-08-26 ENCOUNTER — Ambulatory Visit: Payer: Medicare Other | Attending: Internal Medicine

## 2019-08-26 DIAGNOSIS — Z23 Encounter for immunization: Secondary | ICD-10-CM

## 2019-08-26 NOTE — Progress Notes (Signed)
   Covid-19 Vaccination Clinic  Name:  Muktar Blomberg    MRN: [id number] DOB: [birth year]  08/26/2019  Mr. Schumacker was observed post Covid-19 immunization for 15 minutes without incident. He was provided with Vaccine Information Sheet and instruction to access the V-Safe system.   Mr. Bartolucci was instructed to call 911 with any severe reactions post vaccine: Marland Kitchen Difficulty breathing  . Swelling of face and throat  . A fast heartbeat  . A bad rash all over body  . Dizziness and weakness   Immunizations Administered    Name Date Dose VIS Date [place] COVID-19 Vaccine 08/26/2019  2:24 PM 0.3 mL 06/14/2018 Intramuscular   Manufacturer: [place]   Lot: [id number]   [place]: [id number]

## 2019-08-28 ENCOUNTER — Other Ambulatory Visit: Payer: Self-pay | Admitting: Neurology

## 2019-08-28 DIAGNOSIS — G8929 Other chronic pain: Secondary | ICD-10-CM

## 2019-08-28 DIAGNOSIS — M25512 Pain in left shoulder: Secondary | ICD-10-CM

## 2019-08-28 DIAGNOSIS — M79602 Pain in left arm: Secondary | ICD-10-CM

## 2019-09-06 ENCOUNTER — Ambulatory Visit
Admission: RE | Admit: 2019-09-06 | Discharge: 2019-09-06 | Disposition: A | Discharge status: Home / Self Care | Payer: Medicare Other | Source: Ambulatory Visit | Attending: Neurology | Admitting: Neurology

## 2019-09-06 ENCOUNTER — Other Ambulatory Visit: Payer: Self-pay

## 2019-09-06 DIAGNOSIS — M25512 Pain in left shoulder: Secondary | ICD-10-CM | POA: Insufficient documentation

## 2019-09-06 DIAGNOSIS — G8929 Other chronic pain: Secondary | ICD-10-CM | POA: Diagnosis present

## 2019-09-06 DIAGNOSIS — M79602 Pain in left arm: Secondary | ICD-10-CM | POA: Insufficient documentation

## 2019-11-07 IMAGING — CR DG ELBOW COMPLETE 3+V*R*
4 series · 4 of 4 positions shown · non-contrast
Comparison: None.

CLINICAL DATA: 61-year-old male with right elbow injury and pain.

EXAM:
RIGHT ELBOW - COMPLETE 3+ VIEW

[elbow ap]
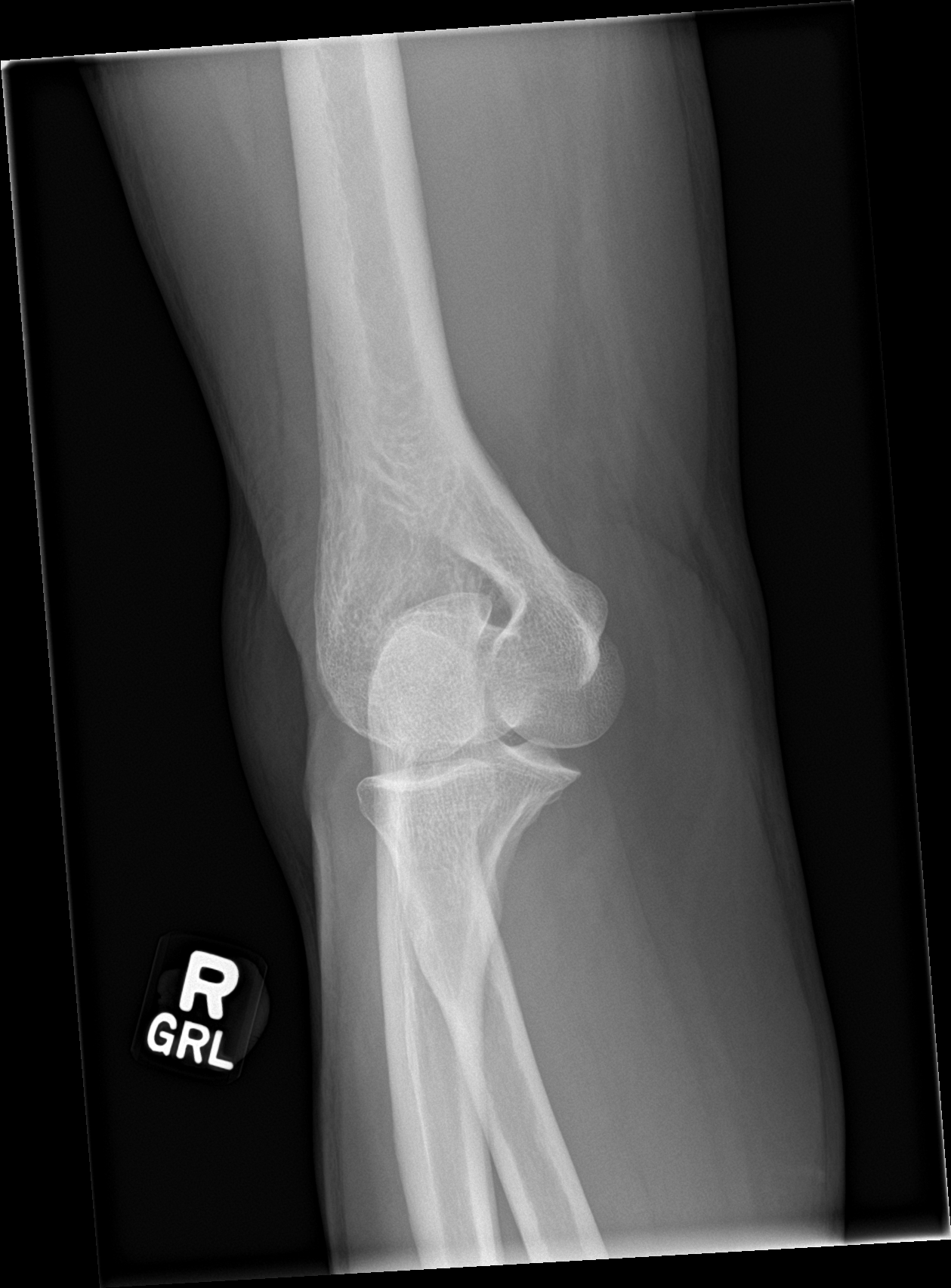

[elbow obl (1 of 2)]
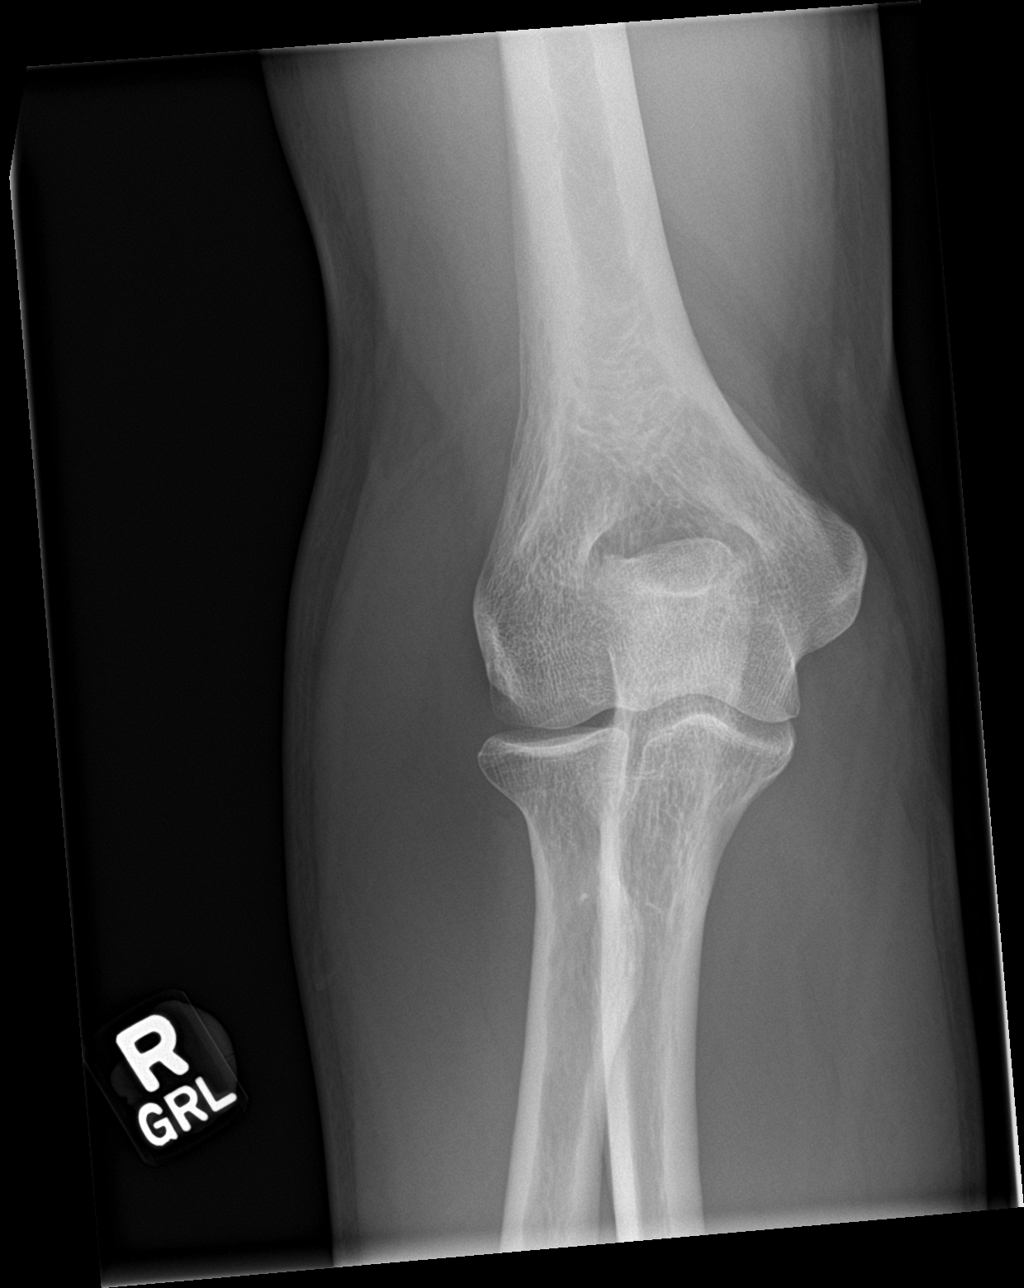

[elbow obl (2 of 2)]
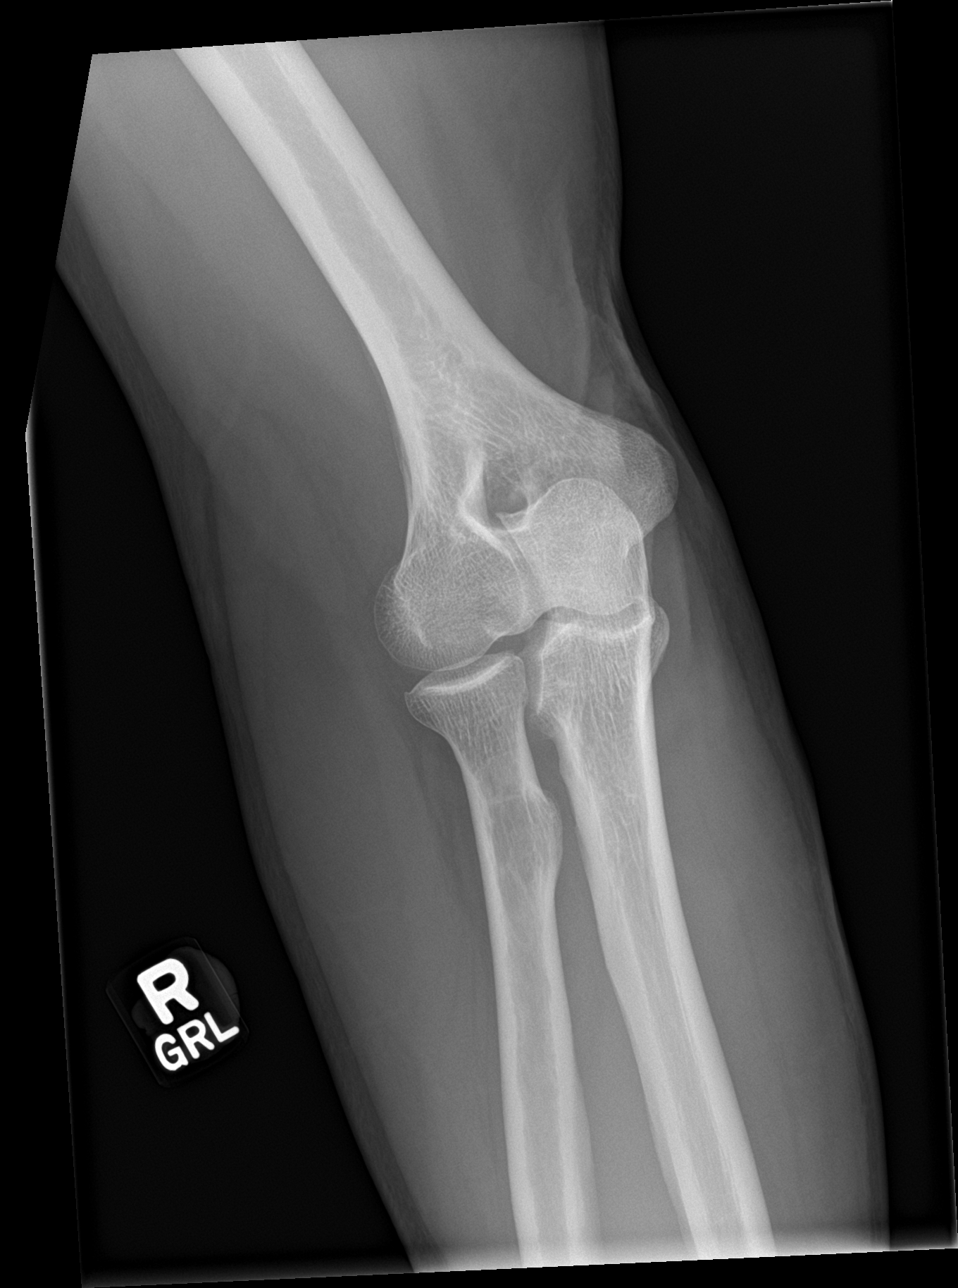

[elbow lat]
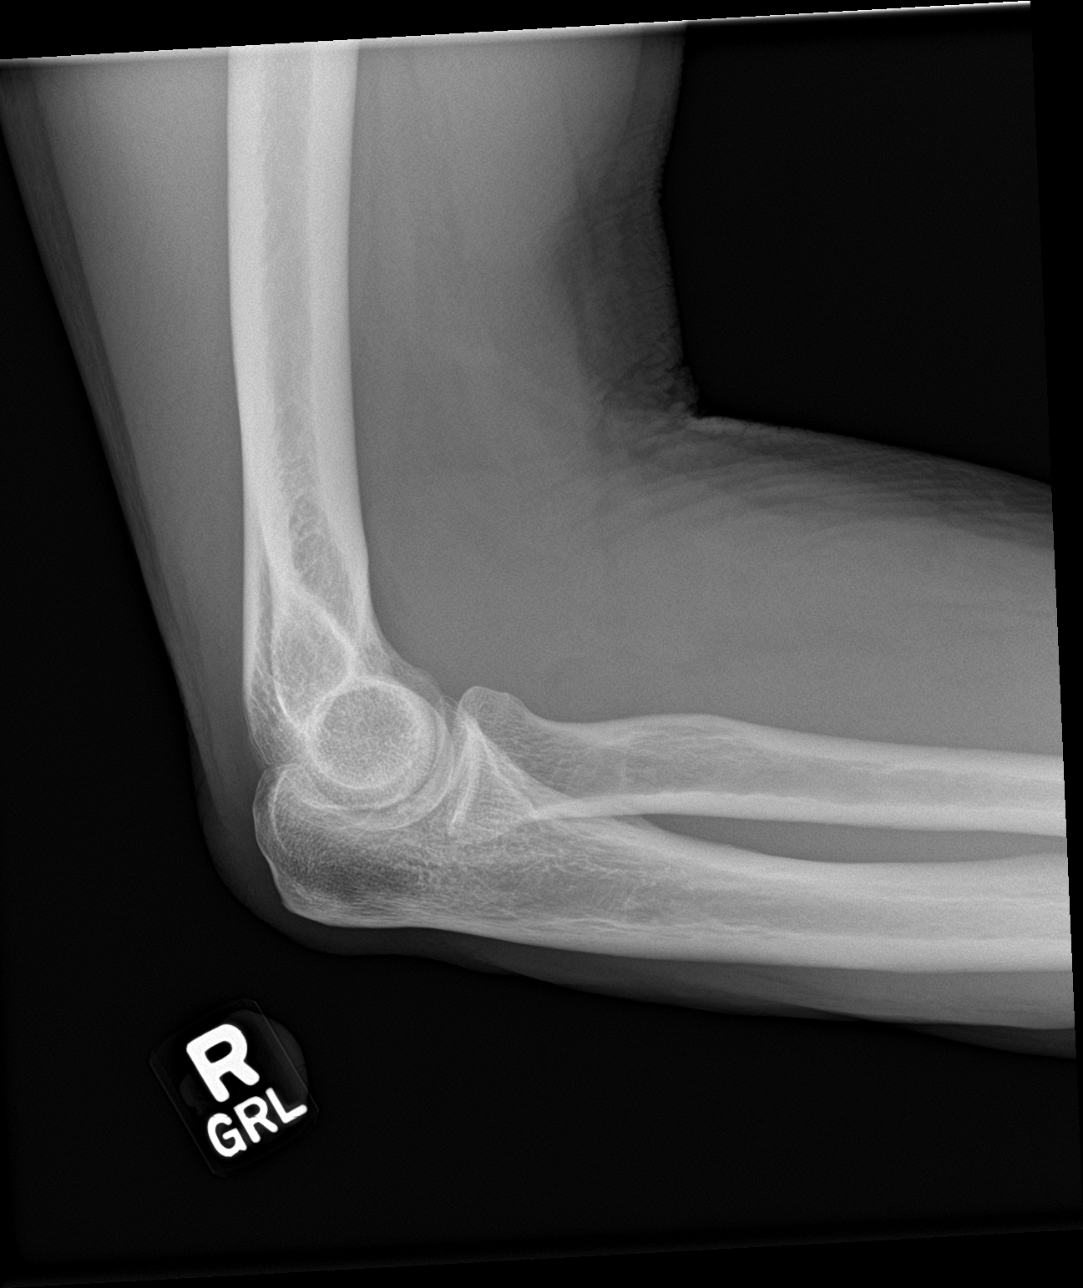

[4 of 4 positions shown; findings below may reference images not displayed]

FINDINGS: There is no evidence of fracture, dislocation, or joint effusion.
There is no evidence of arthropathy or other focal bone abnormality.
Soft tissues are unremarkable.
IMPRESSION: Negative.

## 2019-12-07 ENCOUNTER — Other Ambulatory Visit: Payer: Self-pay

## 2019-12-07 ENCOUNTER — Emergency Department
Admission: EM | Admit: 2019-12-07 | Discharge: 2019-12-07 | Disposition: A | Discharge status: Home / Self Care | Payer: Medicare Other | Attending: Emergency Medicine | Admitting: Emergency Medicine

## 2019-12-07 ENCOUNTER — Emergency Department: Payer: Medicare Other

## 2019-12-07 ENCOUNTER — Encounter: Payer: Self-pay | Admitting: Emergency Medicine

## 2019-12-07 DIAGNOSIS — E785 Hyperlipidemia, unspecified: Secondary | ICD-10-CM | POA: Diagnosis not present

## 2019-12-07 DIAGNOSIS — Z87891 Personal history of nicotine dependence: Secondary | ICD-10-CM | POA: Insufficient documentation

## 2019-12-07 DIAGNOSIS — R7989 Other specified abnormal findings of blood chemistry: Secondary | ICD-10-CM | POA: Insufficient documentation

## 2019-12-07 DIAGNOSIS — T675XXA Heat exhaustion, unspecified, initial encounter: Secondary | ICD-10-CM | POA: Insufficient documentation

## 2019-12-07 DIAGNOSIS — Z7982 Long term (current) use of aspirin: Secondary | ICD-10-CM | POA: Insufficient documentation

## 2019-12-07 LAB — MAGNESIUM: Magnesium: 2 mg/dL (ref 1.7–2.4)

## 2019-12-07 LAB — COMPREHENSIVE METABOLIC PANEL
ALT: 46 U/L — ABNORMAL HIGH (ref 0–44)
AST: 41 U/L (ref 15–41)
Albumin: 4 g/dL (ref 3.5–5.0)
Alkaline Phosphatase: 73 U/L (ref 38–126)
Anion gap: 11 (ref 5–15)
BUN: 20 mg/dL (ref 8–23)
CO2: 22 mmol/L (ref 22–32)
Calcium: 8.8 mg/dL — ABNORMAL LOW (ref 8.9–10.3)
Chloride: 103 mmol/L (ref 98–111)
Creatinine, Ser: 1.76 mg/dL — ABNORMAL HIGH (ref 0.61–1.24)
GFR calc Af Amer: 47 mL/min — ABNORMAL LOW (ref >60)
GFR calc non Af Amer: 41 mL/min — ABNORMAL LOW (ref >60)
Glucose, Bld: 93 mg/dL (ref 70–99)
Potassium: 4.4 mmol/L (ref 3.5–5.1)
Sodium: 136 mmol/L (ref 135–145)
Total Bilirubin: 0.7 mg/dL (ref 0.3–1.2)
Total Protein: 6.8 g/dL (ref 6.5–8.1)

## 2019-12-07 LAB — CBC WITH DIFFERENTIAL/PLATELET
Abs Immature Granulocytes: 0.05 10*3/uL (ref 0.00–0.07)
Basophils Absolute: 0.1 10*3/uL (ref 0.0–0.1)
Basophils Relative: 1 %
Eosinophils Absolute: 0.2 10*3/uL (ref 0.0–0.5)
Eosinophils Relative: 1 %
HCT: 38.2 % — ABNORMAL LOW (ref 39.0–52.0)
Hemoglobin: 13.8 g/dL (ref 13.0–17.0)
Immature Granulocytes: 0 %
Lymphocytes Relative: 22 %
Lymphs Abs: 2.8 10*3/uL (ref 0.7–4.0)
MCH: 32.8 pg (ref 26.0–34.0)
MCHC: 36.1 g/dL — ABNORMAL HIGH (ref 30.0–36.0)
MCV: 90.7 fL (ref 80.0–100.0)
Monocytes Absolute: 1.6 10*3/uL — ABNORMAL HIGH (ref 0.1–1.0)
Monocytes Relative: 13 %
Neutro Abs: 7.9 10*3/uL — ABNORMAL HIGH (ref 1.7–7.7)
Neutrophils Relative %: 63 %
Platelets: 271 10*3/uL (ref 150–400)
RBC: 4.21 MIL/uL — ABNORMAL LOW (ref 4.22–5.81)
RDW: 12.9 % (ref 11.5–15.5)
WBC: 12.6 10*3/uL — ABNORMAL HIGH (ref 4.0–10.5)
nRBC: 0 % (ref 0.0–0.2)

## 2019-12-07 LAB — CK: Total CK: 458 U/L — ABNORMAL HIGH (ref 49–397)

## 2019-12-07 MED ORDER — SODIUM CHLORIDE 0.9 % IV SOLN: Freq: Once | INTRAVENOUS | Status: AC

## 2019-12-07 MED ORDER — LORAZEPAM 2 MG/ML IJ SOLN
1.0000 mg | Freq: Once | INTRAMUSCULAR | Status: AC
  Administered 2019-12-07: 1 mg via INTRAVENOUS

## 2019-12-07 MED ORDER — ONDANSETRON HCL 4 MG/2ML IJ SOLN
4.0000 mg | Freq: Once | INTRAMUSCULAR | Status: AC
  Administered 2019-12-07: 4 mg via INTRAVENOUS
  Filled 2019-12-07: qty 2

## 2019-12-07 MED ORDER — MORPHINE SULFATE (PF) 4 MG/ML IV SOLN
4.0000 mg | Freq: Once | INTRAVENOUS | Status: AC
  Administered 2019-12-07: 4 mg via INTRAVENOUS
  Filled 2019-12-07: qty 1

## 2019-12-07 MED ORDER — SODIUM CHLORIDE 0.9 % IV BOLUS
1000.0000 mL | Freq: Once | INTRAVENOUS | Status: AC
  Administered 2019-12-07: 1000 mL via INTRAVENOUS

## 2019-12-07 NOTE — Discharge Instructions (Addendum)
Your creatinine was slightly elevated, which suggests mild chronic kidney disease. This could be due to dehydration and heat exposure, but I'd recommend staying hydrated and following up for a repeat with your doctor in 2-3 weeks.  Minimize/avoid ADVIL, MOTRIN, NAPROXEN, ALLEVE or other NSAID medications until your kidney function improves. You can take TYLENOL/ACETAMINOPHEN for pain.

## 2019-12-07 NOTE — ED Notes (Signed)
Pt denies nausea after eating/drinking.

## 2019-12-07 NOTE — ED Notes (Signed)
Pt ambulated in room independently with a steady gait with this RN at bedside. Pt denies CP/SHOB/dizziness at this time.  Pt st feeling "sore" in legs at this time.

## 2019-12-07 NOTE — ED Notes (Addendum)
Pt provided with sandwich tray water and blankets. Spouse at bedside requesting PB and blankets.   Family provided with food and blankets by this Rn.  This Rn will monitor pt and reassess in 15 minutes.

## 2019-12-07 NOTE — ED Triage Notes (Addendum)
Pt from work via [employer]. 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   Pt arrives diaphoretic with ice packs on groin and neck. In route given 2x 585ml NS and 500 LR.   Pt A/Ox4 and speaking.  EDP Issacs at bedside upon arrival.

## 2019-12-07 NOTE — ED Notes (Signed)
EDP Issacs at bedside.
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
## 2020-02-23 ENCOUNTER — Other Ambulatory Visit: Payer: Self-pay

## 2020-02-23 ENCOUNTER — Emergency Department: Payer: Medicare Other

## 2020-02-23 ENCOUNTER — Emergency Department
Admission: EM | Admit: 2020-02-23 | Discharge: 2020-02-23 | Disposition: A | Discharge status: Home / Self Care | Payer: Medicare Other | Attending: Emergency Medicine | Admitting: Emergency Medicine

## 2020-02-23 DIAGNOSIS — Z7982 Long term (current) use of aspirin: Secondary | ICD-10-CM | POA: Insufficient documentation

## 2020-02-23 DIAGNOSIS — L02412 Cutaneous abscess of left axilla: Secondary | ICD-10-CM | POA: Insufficient documentation

## 2020-02-23 DIAGNOSIS — W182XXA Fall in (into) shower or empty bathtub, initial encounter: Secondary | ICD-10-CM | POA: Insufficient documentation

## 2020-02-23 DIAGNOSIS — S41132A Puncture wound without foreign body of left upper arm, initial encounter: Secondary | ICD-10-CM | POA: Diagnosis not present

## 2020-02-23 DIAGNOSIS — S2242XA Multiple fractures of ribs, left side, initial encounter for closed fracture: Secondary | ICD-10-CM | POA: Insufficient documentation

## 2020-02-23 DIAGNOSIS — S299XXA Unspecified injury of thorax, initial encounter: Secondary | ICD-10-CM | POA: Diagnosis present

## 2020-02-23 DIAGNOSIS — Z91040 Latex allergy status: Secondary | ICD-10-CM | POA: Diagnosis not present

## 2020-02-23 DIAGNOSIS — T1490XA Injury, unspecified, initial encounter: Secondary | ICD-10-CM

## 2020-02-23 DIAGNOSIS — Z23 Encounter for immunization: Secondary | ICD-10-CM | POA: Insufficient documentation

## 2020-02-23 DIAGNOSIS — Z87891 Personal history of nicotine dependence: Secondary | ICD-10-CM | POA: Diagnosis not present

## 2020-02-23 DIAGNOSIS — T148XXA Other injury of unspecified body region, initial encounter: Secondary | ICD-10-CM

## 2020-02-23 DIAGNOSIS — L02419 Cutaneous abscess of limb, unspecified: Secondary | ICD-10-CM

## 2020-02-23 LAB — CBC WITH DIFFERENTIAL/PLATELET
Abs Immature Granulocytes: 0.01 10*3/uL (ref 0.00–0.07)
Basophils Absolute: 0.1 10*3/uL (ref 0.0–0.1)
Basophils Relative: 1 %
Eosinophils Absolute: 0.2 10*3/uL (ref 0.0–0.5)
Eosinophils Relative: 3 %
HCT: 42.2 % (ref 39.0–52.0)
Hemoglobin: 14.6 g/dL (ref 13.0–17.0)
Immature Granulocytes: 0 %
Lymphocytes Relative: 26 %
Lymphs Abs: 1.9 10*3/uL (ref 0.7–4.0)
MCH: 32.5 pg (ref 26.0–34.0)
MCHC: 34.6 g/dL (ref 30.0–36.0)
MCV: 94 fL (ref 80.0–100.0)
Monocytes Absolute: 0.9 10*3/uL (ref 0.1–1.0)
Monocytes Relative: 12 %
Neutro Abs: 4.4 10*3/uL (ref 1.7–7.7)
Neutrophils Relative %: 58 %
Platelets: 297 10*3/uL (ref 150–400)
RBC: 4.49 MIL/uL (ref 4.22–5.81)
RDW: 12.5 % (ref 11.5–15.5)
WBC: 7.6 10*3/uL (ref 4.0–10.5)
nRBC: 0 % (ref 0.0–0.2)

## 2020-02-23 LAB — COMPREHENSIVE METABOLIC PANEL
ALT: 47 U/L — ABNORMAL HIGH (ref 0–44)
AST: 45 U/L — ABNORMAL HIGH (ref 15–41)
Albumin: 4.4 g/dL (ref 3.5–5.0)
Alkaline Phosphatase: 88 U/L (ref 38–126)
Anion gap: 10 (ref 5–15)
BUN: 21 mg/dL (ref 8–23)
CO2: 27 mmol/L (ref 22–32)
Calcium: 9.6 mg/dL (ref 8.9–10.3)
Chloride: 98 mmol/L (ref 98–111)
Creatinine, Ser: 1.04 mg/dL (ref 0.61–1.24)
GFR, Estimated: >60 mL/min (ref >60)
Glucose, Bld: 102 mg/dL — ABNORMAL HIGH (ref 70–99)
Potassium: 4.4 mmol/L (ref 3.5–5.1)
Sodium: 135 mmol/L (ref 135–145)
Total Bilirubin: 0.7 mg/dL (ref 0.3–1.2)
Total Protein: 7.8 g/dL (ref 6.5–8.1)

## 2020-02-23 LAB — URINALYSIS, COMPLETE (UACMP) WITH MICROSCOPIC
Bacteria, UA: NONE SEEN
Bilirubin Urine: NEGATIVE
Glucose, UA: NEGATIVE mg/dL
Hgb urine dipstick: NEGATIVE
Ketones, ur: NEGATIVE mg/dL
Leukocytes,Ua: NEGATIVE
Nitrite: NEGATIVE
Protein, ur: NEGATIVE mg/dL
Specific Gravity, Urine: 1.018 (ref 1.005–1.030)
pH: 5 (ref 5.0–8.0)

## 2020-02-23 MED ORDER — CLINDAMYCIN HCL 300 MG PO CAPS: 300.0000 mg | ORAL_CAPSULE | Freq: Three times a day (TID) | ORAL | 0 refills | Status: AC

## 2020-02-23 MED ORDER — TETANUS-DIPHTH-ACELL PERTUSSIS 5-2.5-18.5 LF-MCG/0.5 IM SUSY
0.5000 mL | PREFILLED_SYRINGE | Freq: Once | INTRAMUSCULAR | Status: AC
  Administered 2020-02-23: 0.5 mL via INTRAMUSCULAR
  Filled 2020-02-23: qty 0.5

## 2020-02-23 MED ORDER — ACETAMINOPHEN 325 MG PO TABS
650.0000 mg | ORAL_TABLET | Freq: Once | ORAL | Status: AC
  Administered 2020-02-23: 650 mg via ORAL
  Filled 2020-02-23: qty 2

## 2020-02-23 MED ORDER — MORPHINE SULFATE (PF) 4 MG/ML IV SOLN
4.0000 mg | Freq: Once | INTRAVENOUS | Status: AC
  Administered 2020-02-23: 4 mg via INTRAVENOUS
  Filled 2020-02-23: qty 1

## 2020-02-23 MED ORDER — MELOXICAM 15 MG PO TABS: 15.0000 mg | ORAL_TABLET | Freq: Every day | ORAL | 0 refills | Status: AC

## 2020-02-23 MED ORDER — IOHEXOL 300 MG/ML  SOLN
100.0000 mL | Freq: Once | INTRAMUSCULAR | Status: AC | PRN
  Administered 2020-02-23: 100 mL via INTRAVENOUS
  Filled 2020-02-23: qty 100

## 2020-02-23 MED ORDER — LIDOCAINE 5 % EX PTCH
1.0000 | MEDICATED_PATCH | CUTANEOUS | Status: DC
  Administered 2020-02-23: 1 via TRANSDERMAL
  Filled 2020-02-23: qty 1

## 2020-02-23 MED ORDER — METHOCARBAMOL 500 MG PO TABS
750.0000 mg | ORAL_TABLET | Freq: Once | ORAL | Status: AC
  Administered 2020-02-23: 750 mg via ORAL
  Filled 2020-02-23: qty 2

## 2020-02-23 MED ORDER — CLINDAMYCIN HCL 150 MG PO CAPS
300.0000 mg | ORAL_CAPSULE | Freq: Once | ORAL | Status: AC
  Administered 2020-02-23: 300 mg via ORAL
  Filled 2020-02-23: qty 2

## 2020-02-23 MED ORDER — MELOXICAM 7.5 MG PO TABS
15.0000 mg | ORAL_TABLET | Freq: Once | ORAL | Status: AC
  Administered 2020-02-23: 15 mg via ORAL
  Filled 2020-02-23: qty 2

## 2020-02-23 MED ORDER — LIDOCAINE 4 % EX PTCH: 1.0000 | MEDICATED_PATCH | Freq: Two times a day (BID) | CUTANEOUS | 0 refills | Status: AC

## 2020-02-23 MED ORDER — METHOCARBAMOL 750 MG PO TABS: 750.0000 mg | ORAL_TABLET | Freq: Four times a day (QID) | ORAL | 0 refills | Status: AC | PRN

## 2020-02-23 NOTE — ED Notes (Signed)
ED Provider at bedside. 

## 2020-02-23 NOTE — ED Triage Notes (Signed)
Pt to ED POV for fall on Tuesday in bath tub. Pt states has no mat in tub and it was slippery, denies hitting head or LOC.  C/o pain left side from hitting shelf and cut to right underarm from hitting basket.

## 2020-02-23 NOTE — ED Notes (Signed)
Patient transported to CT 
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
## 2020-02-23 NOTE — ED Notes (Signed)
Provider at bedside

## 2020-02-23 NOTE — ED Notes (Signed)
Pt back from imaging

## 2020-03-28 ENCOUNTER — Emergency Department: Payer: Medicare Other

## 2020-03-28 ENCOUNTER — Other Ambulatory Visit: Payer: Self-pay

## 2020-03-28 ENCOUNTER — Emergency Department
Admission: EM | Admit: 2020-03-28 | Discharge: 2020-03-28 | Disposition: A | Discharge status: Home / Self Care | Payer: Medicare Other | Attending: Emergency Medicine | Admitting: Emergency Medicine

## 2020-03-28 DIAGNOSIS — R1030 Lower abdominal pain, unspecified: Secondary | ICD-10-CM | POA: Diagnosis not present

## 2020-03-28 DIAGNOSIS — N50819 Testicular pain, unspecified: Secondary | ICD-10-CM

## 2020-03-28 DIAGNOSIS — Z5321 Procedure and treatment not carried out due to patient leaving prior to being seen by health care provider: Secondary | ICD-10-CM | POA: Diagnosis not present

## 2020-03-28 DIAGNOSIS — N50812 Left testicular pain: Secondary | ICD-10-CM | POA: Diagnosis not present

## 2020-03-28 LAB — BASIC METABOLIC PANEL
Anion gap: 10 (ref 5–15)
BUN: 21 mg/dL (ref 8–23)
CO2: 25 mmol/L (ref 22–32)
Calcium: 9.2 mg/dL (ref 8.9–10.3)
Chloride: 103 mmol/L (ref 98–111)
Creatinine, Ser: 1.17 mg/dL (ref 0.61–1.24)
GFR, Estimated: >60 mL/min (ref >60)
Glucose, Bld: 82 mg/dL (ref 70–99)
Potassium: 4.4 mmol/L (ref 3.5–5.1)
Sodium: 138 mmol/L (ref 135–145)

## 2020-03-28 LAB — CBC
HCT: 42.2 % (ref 39.0–52.0)
Hemoglobin: 14.5 g/dL (ref 13.0–17.0)
MCH: 32.2 pg (ref 26.0–34.0)
MCHC: 34.4 g/dL (ref 30.0–36.0)
MCV: 93.8 fL (ref 80.0–100.0)
Platelets: 258 10*3/uL (ref 150–400)
RBC: 4.5 MIL/uL (ref 4.22–5.81)
RDW: 12.4 % (ref 11.5–15.5)
WBC: 10.1 10*3/uL (ref 4.0–10.5)
nRBC: 0 % (ref 0.0–0.2)

## 2020-03-28 LAB — URINALYSIS, COMPLETE (UACMP) WITH MICROSCOPIC
Bacteria, UA: NONE SEEN
Bilirubin Urine: NEGATIVE
Glucose, UA: NEGATIVE mg/dL
Hgb urine dipstick: NEGATIVE
Ketones, ur: NEGATIVE mg/dL
Leukocytes,Ua: NEGATIVE
Nitrite: NEGATIVE
Protein, ur: NEGATIVE mg/dL
Specific Gravity, Urine: 1.017 (ref 1.005–1.030)
Squamous Epithelial / HPF: NONE SEEN (ref 0–5)
WBC, UA: NONE SEEN WBC/hpf (ref 0–5)
pH: 6 (ref 5.0–8.0)

## 2020-03-28 NOTE — ED Triage Notes (Signed)
Pt arrives POV w cc of L testicle pain that radiates into groin. Denies injury, was sitting on recliner about 15 min ago when pain came on. Denies swelling, redness, knots. Pt states he just got home from hunting, no new sexual partners.

## 2020-12-15 ENCOUNTER — Other Ambulatory Visit: Payer: Self-pay

## 2020-12-15 ENCOUNTER — Emergency Department
Admission: EM | Admit: 2020-12-15 | Discharge: 2020-12-15 | Disposition: A | Discharge status: Home / Self Care | Payer: Medicare Other | Attending: Emergency Medicine | Admitting: Emergency Medicine

## 2020-12-15 ENCOUNTER — Emergency Department: Payer: Medicare Other

## 2020-12-15 DIAGNOSIS — Z87891 Personal history of nicotine dependence: Secondary | ICD-10-CM | POA: Diagnosis not present

## 2020-12-15 DIAGNOSIS — R197 Diarrhea, unspecified: Secondary | ICD-10-CM

## 2020-12-15 DIAGNOSIS — Z79899 Other long term (current) drug therapy: Secondary | ICD-10-CM | POA: Diagnosis not present

## 2020-12-15 DIAGNOSIS — U071 COVID-19: Secondary | ICD-10-CM | POA: Diagnosis not present

## 2020-12-15 DIAGNOSIS — R112 Nausea with vomiting, unspecified: Secondary | ICD-10-CM | POA: Diagnosis present

## 2020-12-15 DIAGNOSIS — Z7982 Long term (current) use of aspirin: Secondary | ICD-10-CM | POA: Diagnosis not present

## 2020-12-15 LAB — BASIC METABOLIC PANEL
Anion gap: 8 (ref 5–15)
BUN: 32 mg/dL — ABNORMAL HIGH (ref 8–23)
CO2: 25 mmol/L (ref 22–32)
Calcium: 9.4 mg/dL (ref 8.9–10.3)
Chloride: 102 mmol/L (ref 98–111)
Creatinine, Ser: 1.22 mg/dL (ref 0.61–1.24)
GFR, Estimated: >60 mL/min (ref >60)
Glucose, Bld: 138 mg/dL — ABNORMAL HIGH (ref 70–99)
Potassium: 4.1 mmol/L (ref 3.5–5.1)
Sodium: 135 mmol/L (ref 135–145)

## 2020-12-15 LAB — URINALYSIS, COMPLETE (UACMP) WITH MICROSCOPIC
Bacteria, UA: NONE SEEN
Bilirubin Urine: NEGATIVE
Glucose, UA: NEGATIVE mg/dL
Hgb urine dipstick: NEGATIVE
Ketones, ur: 5 mg/dL — AB
Leukocytes,Ua: NEGATIVE
Nitrite: NEGATIVE
Protein, ur: NEGATIVE mg/dL
Specific Gravity, Urine: 1.03 (ref 1.005–1.030)
Squamous Epithelial / HPF: NONE SEEN (ref 0–5)
pH: 5 (ref 5.0–8.0)

## 2020-12-15 LAB — CBC
HCT: 42.9 % (ref 39.0–52.0)
Hemoglobin: 15.5 g/dL (ref 13.0–17.0)
MCH: 33.8 pg (ref 26.0–34.0)
MCHC: 36.1 g/dL — ABNORMAL HIGH (ref 30.0–36.0)
MCV: 93.5 fL (ref 80.0–100.0)
Platelets: 258 10*3/uL (ref 150–400)
RBC: 4.59 MIL/uL (ref 4.22–5.81)
RDW: 12.3 % (ref 11.5–15.5)
WBC: 7.3 10*3/uL (ref 4.0–10.5)
nRBC: 0 % (ref 0.0–0.2)

## 2020-12-15 LAB — RESP PANEL BY RT-PCR (FLU A&B, COVID) ARPGX2
Influenza A by PCR: NEGATIVE
Influenza B by PCR: NEGATIVE
SARS Coronavirus 2 by RT PCR: POSITIVE — AB

## 2020-12-15 MED ORDER — KETOROLAC TROMETHAMINE 30 MG/ML IJ SOLN
30.0000 mg | Freq: Once | INTRAMUSCULAR | Status: AC
  Administered 2020-12-15: 30 mg via INTRAVENOUS
  Filled 2020-12-15: qty 1

## 2020-12-15 MED ORDER — SODIUM CHLORIDE 0.9 % IV BOLUS
1000.0000 mL | Freq: Once | INTRAVENOUS | Status: AC
  Administered 2020-12-15: 1000 mL via INTRAVENOUS

## 2020-12-15 MED ORDER — ONDANSETRON 4 MG PO TBDP: 4.0000 mg | ORAL_TABLET | Freq: Three times a day (TID) | ORAL | 0 refills | Status: DC | PRN

## 2020-12-15 MED ORDER — ONDANSETRON HCL 4 MG/2ML IJ SOLN
4.0000 mg | Freq: Once | INTRAMUSCULAR | Status: AC
  Administered 2020-12-15: 4 mg via INTRAVENOUS
  Filled 2020-12-15: qty 2

## 2020-12-15 NOTE — ED Triage Notes (Signed)
Pt took 3 days of carbamazapine last week. Stopped Thursday because n/v/d. Symptoms got worse and today SOB with exertion, weakness. Has not vomited since Saturday morning but also cannot keep anything down. Dr Manuella Ghazi concerned for virus vs med reaction.

## 2020-12-15 NOTE — ED Notes (Signed)
Pt able to ambulate to toilet and void sans complications.
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
## 2021-05-03 ENCOUNTER — Emergency Department
Admission: EM | Admit: 2021-05-03 | Discharge: 2021-05-03 | Disposition: A | Discharge status: Home / Self Care | Payer: Medicare Other | Attending: Emergency Medicine | Admitting: Emergency Medicine

## 2021-05-03 ENCOUNTER — Other Ambulatory Visit: Payer: Self-pay

## 2021-05-03 ENCOUNTER — Encounter: Payer: Self-pay | Admitting: Emergency Medicine

## 2021-05-03 DIAGNOSIS — L270 Generalized skin eruption due to drugs and medicaments taken internally: Secondary | ICD-10-CM | POA: Diagnosis not present

## 2021-05-03 DIAGNOSIS — T367X5A Adverse effect of antifungal antibiotics, systemically used, initial encounter: Secondary | ICD-10-CM | POA: Insufficient documentation

## 2021-05-03 DIAGNOSIS — R21 Rash and other nonspecific skin eruption: Secondary | ICD-10-CM | POA: Diagnosis present

## 2021-05-03 LAB — COMPREHENSIVE METABOLIC PANEL
ALT: 32 U/L (ref 0–44)
AST: 29 U/L (ref 15–41)
Albumin: 4.2 g/dL (ref 3.5–5.0)
Alkaline Phosphatase: 85 U/L (ref 38–126)
Anion gap: 6 (ref 5–15)
BUN: 19 mg/dL (ref 8–23)
CO2: 26 mmol/L (ref 22–32)
Calcium: 9.7 mg/dL (ref 8.9–10.3)
Chloride: 103 mmol/L (ref 98–111)
Creatinine, Ser: 0.99 mg/dL (ref 0.61–1.24)
GFR, Estimated: >60 mL/min (ref >60)
Glucose, Bld: 101 mg/dL — ABNORMAL HIGH (ref 70–99)
Potassium: 4.3 mmol/L (ref 3.5–5.1)
Sodium: 135 mmol/L (ref 135–145)
Total Bilirubin: 0.9 mg/dL (ref 0.3–1.2)
Total Protein: 7.7 g/dL (ref 6.5–8.1)

## 2021-05-03 LAB — CBC WITH DIFFERENTIAL/PLATELET
Abs Immature Granulocytes: 0.03 10*3/uL (ref 0.00–0.07)
Basophils Absolute: 0.1 10*3/uL (ref 0.0–0.1)
Basophils Relative: 1 %
Eosinophils Absolute: 0.7 10*3/uL — ABNORMAL HIGH (ref 0.0–0.5)
Eosinophils Relative: 10 %
HCT: 41.9 % (ref 39.0–52.0)
Hemoglobin: 14.5 g/dL (ref 13.0–17.0)
Immature Granulocytes: 0 %
Lymphocytes Relative: 22 %
Lymphs Abs: 1.6 10*3/uL (ref 0.7–4.0)
MCH: 32.2 pg (ref 26.0–34.0)
MCHC: 34.6 g/dL (ref 30.0–36.0)
MCV: 93.1 fL (ref 80.0–100.0)
Monocytes Absolute: 0.7 10*3/uL (ref 0.1–1.0)
Monocytes Relative: 10 %
Neutro Abs: 4.1 10*3/uL (ref 1.7–7.7)
Neutrophils Relative %: 57 %
Platelets: 269 10*3/uL (ref 150–400)
RBC: 4.5 MIL/uL (ref 4.22–5.81)
RDW: 12.7 % (ref 11.5–15.5)
WBC: 7.2 10*3/uL (ref 4.0–10.5)
nRBC: 0 % (ref 0.0–0.2)

## 2021-05-03 MED ORDER — TRIAMCINOLONE ACETONIDE 0.1 % EX CREA: 1.0000 "application " | TOPICAL_CREAM | Freq: Two times a day (BID) | CUTANEOUS | 0 refills | Status: AC

## 2021-05-03 NOTE — ED Notes (Signed)
Pt to ED for intensely pruritic rash over whole body that started 2 weeks ago. 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 Rash started at groin and spread over entire anterior and posterior body including extremities and back. States "it feels like I've been rolling around in insulation".
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
## 2021-05-03 NOTE — ED Triage Notes (Signed)
Rash to body.  Rash is itchy.  Seen by PCP for same, prescribed Lamisil.  States rash has not improved since taking medication.

## 2021-05-03 NOTE — Discharge Instructions (Signed)
I am concerned that the rash you have could be a result of the terbinafine.  Please stop taking the terbinafine.  You can continue to use the topical antifungal agent in your groin in your armpits.  I would like you to start using the topical steroid triamcinolone on the rest of your body which should help with the itching and the rash.  Reasons to return to the emergency department would be lesions in your mouth, fever or feeling generally unwell.  Your blood work overall was reassuring.  I would like you to follow-up with a dermatologist, listed above.

## 2021-05-05 ENCOUNTER — Ambulatory Visit
Admission: EM | Admit: 2021-05-05 | Discharge: 2021-05-05 | Disposition: A | Discharge status: Home / Self Care | Payer: Medicare Other | Attending: Physician Assistant | Admitting: Physician Assistant

## 2021-05-05 ENCOUNTER — Encounter: Payer: Self-pay | Admitting: Emergency Medicine

## 2021-05-05 DIAGNOSIS — H60503 Unspecified acute noninfective otitis externa, bilateral: Secondary | ICD-10-CM

## 2021-05-05 MED ORDER — NEOMYCIN-POLYMYXIN-HC 3.5-10000-1 OT SOLN: 3.0000 [drp] | Freq: Three times a day (TID) | OTIC | 0 refills | Status: AC

## 2021-05-05 MED ORDER — METHYLPREDNISOLONE SODIUM SUCC 125 MG IJ SOLR
125.0000 mg | Freq: Once | INTRAMUSCULAR | Status: AC
  Administered 2021-05-05: 125 mg via INTRAMUSCULAR

## 2021-05-05 MED ORDER — FLUTICASONE PROPIONATE 50 MCG/ACT NA SUSP: 1.0000 | Freq: Every day | NASAL | 2 refills | Status: DC

## 2021-05-05 NOTE — ED Triage Notes (Signed)
Pt was seen in the ED 1/14 for rash and prior to that by PCP. Rash is better, but now feels like it is "spreading into ears and sinuses."

## 2021-05-05 NOTE — Discharge Instructions (Signed)
Continue current topical medications
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
## 2021-08-07 ENCOUNTER — Other Ambulatory Visit: Payer: Self-pay | Admitting: Neurology

## 2021-08-07 DIAGNOSIS — M62830 Muscle spasm of back: Secondary | ICD-10-CM

## 2021-08-14 ENCOUNTER — Ambulatory Visit: Admission: RE | Admit: 2021-08-14 | Payer: Medicare Other | Source: Ambulatory Visit

## 2021-08-18 ENCOUNTER — Ambulatory Visit
Admission: RE | Admit: 2021-08-18 | Discharge: 2021-08-18 | Disposition: A | Discharge status: Home / Self Care | Payer: Medicare Other | Source: Ambulatory Visit | Attending: Neurology | Admitting: Neurology

## 2021-08-18 DIAGNOSIS — M62830 Muscle spasm of back: Secondary | ICD-10-CM | POA: Insufficient documentation

## 2021-10-06 ENCOUNTER — Emergency Department
Admission: EM | Admit: 2021-10-06 | Discharge: 2021-10-06 | Disposition: A | Discharge status: Home / Self Care | Payer: Medicare Other | Attending: Emergency Medicine | Admitting: Emergency Medicine

## 2021-10-06 ENCOUNTER — Encounter: Payer: Self-pay | Admitting: Emergency Medicine

## 2021-10-06 ENCOUNTER — Other Ambulatory Visit: Payer: Self-pay

## 2021-10-06 ENCOUNTER — Emergency Department: Payer: Medicare Other

## 2021-10-06 DIAGNOSIS — M25561 Pain in right knee: Secondary | ICD-10-CM | POA: Diagnosis present

## 2021-10-06 MED ORDER — ETODOLAC 400 MG PO TABS: 400.0000 mg | ORAL_TABLET | Freq: Two times a day (BID) | ORAL | 0 refills | Status: DC

## 2021-10-06 MED ORDER — HYDROCODONE-ACETAMINOPHEN 5-325 MG PO TABS: 1.0000 | ORAL_TABLET | Freq: Four times a day (QID) | ORAL | 0 refills | Status: DC | PRN

## 2021-10-06 NOTE — Discharge Instructions (Addendum)
Call make an appoint with Dr. Roland Rack who is the orthopedist on-call.  Begin taking etodolac 400 mg twice daily with food.  You may also take Tylenol with this medication if additional pain medication is needed during the day however a prescription for hydrocodone with acetaminophen was sent to the pharmacy to take every 6 hours if needed for moderate to severe pain especially at night when you are trying to sleep.  You cannot take the hydrocodone while driving or operating machinery as it could cause drowsiness and increase your risk for injury.  Wear the knee immobilizer anytime you are up walking to prevent movement of your knee until seen by the orthopedist.

## 2021-10-06 NOTE — ED Triage Notes (Signed)
Says injured right knee 2 weeks ago.  Got worse this am/  feels like knife stabbing in it

## 2021-10-06 NOTE — ED Provider Notes (Signed)
[facility] Provider Note    Event Date/Time   First MD Initiated Contact with Patient 10/06/21 [phone]     (approximate)   History   Knee Pain   HPI  Jake Richards is a 66 y.o. male   presents to the ED with complaint of right knee pain for approximately 2 weeks.  Patient states that there was no actual fall or injury to his knee.  He states that occasionally it feels like a knife stabbing in it.  He states that this morning it was worse.  He is used an over-the-counter knee brace with minimal improvement.  He has not been evaluated by an orthopedist.  Patient has a history of arthritis, bipolar, GERD, neuropathy, sleep apnea and arrhythmia.      Physical Exam   Triage Vital Signs: ED Triage Vitals [10/06/21 0730]  Enc Vitals Group     BP (!) 146/96     Pulse Rate 85     Resp 14     Temp 98.7 F (37.1 C)     Temp Source Oral     SpO2 97 %     Weight 186 lb (84.4 kg)     Height '5\' 7"'$  (1.702 m)     Head Circumference      Peak Flow      Pain Score 9     Pain Loc      Pain Edu?      Excl. in [place]?     Most recent vital signs: Vitals:   10/06/21 0730  BP: (!) 146/96  Pulse: 85  Resp: 14  Temp: 98.7 F (37.1 C)  SpO2: 97%     General: Awake, no distress.  CV:  Good peripheral perfusion.  Resp:  Normal effort.  Abd:  No distention.  Other:  Examination of the right knee there is no gross deformity and no effusion is appreciated on palpation.  Patient is tender in the infra patella area.  Range of motion is slow and guarded.  Gait is intact.  No erythema or discoloration noted.   ED Results / Procedures / Treatments   Labs (all labs ordered are listed, but only abnormal results are displayed) Labs Reviewed - No data to display    RADIOLOGY Right knee x-ray is negative for any acute bony injury.  Radiology reports a possible small effusion.    PROCEDURES:  Critical Care performed:   Procedures   MEDICATIONS ORDERED  IN ED: Medications - No data to display   IMPRESSION / MDM / [place] / ED COURSE  I reviewed the triage vital signs and the nursing notes.   Differential diagnosis includes, but is not limited to, osteoarthritis right knee, knee sprain, gout, acute right knee pain.  66 year old male presents to the ED with complaint of right knee pain for approximately 2 weeks without history of injury.  Patient states for the last 2 days or so it has been more painful and despite wearing an over-the-counter knee brace this is not helped.  X-rays were reassuring that there is no acute bony injury and patient was started on etodolac 400 mg twice daily with food along with hydrocodone if needed for severe pain.  Patient was placed in a knee immobilizer and also given referral to Dr. Roland Rack who is on-call for orthopedics for further evaluation of his right knee.      Patient's presentation is most consistent with acute complicated illness / injury requiring diagnostic workup.  FINAL CLINICAL IMPRESSION(S) / ED DIAGNOSES   Final diagnoses:  Acute pain of right knee     Rx / DC Orders   ED Discharge Orders          Ordered    etodolac (LODINE) 400 MG tablet  2 times daily        10/06/21 0848    HYDROcodone-acetaminophen (NORCO/VICODIN) 5-325 MG tablet  Every 6 hours PRN        10/06/21 0848             Note:  This document was prepared using Dragon voice recognition software and may include unintentional dictation errors.   Johnn Hai, PA-C 10/06/21 1204    Lavonia Drafts, MD 10/06/21 1505

## 2021-10-07 ENCOUNTER — Other Ambulatory Visit: Payer: Self-pay | Admitting: Neurosurgery

## 2021-10-07 ENCOUNTER — Other Ambulatory Visit: Payer: Self-pay | Admitting: Orthopedic Surgery

## 2021-10-07 DIAGNOSIS — G609 Hereditary and idiopathic neuropathy, unspecified: Secondary | ICD-10-CM

## 2021-10-07 DIAGNOSIS — M5412 Radiculopathy, cervical region: Secondary | ICD-10-CM

## 2021-10-07 DIAGNOSIS — M2391 Unspecified internal derangement of right knee: Secondary | ICD-10-CM

## 2021-10-07 DIAGNOSIS — M2351 Chronic instability of knee, right knee: Secondary | ICD-10-CM

## 2021-10-07 DIAGNOSIS — G959 Disease of spinal cord, unspecified: Secondary | ICD-10-CM

## 2021-10-13 ENCOUNTER — Ambulatory Visit: Payer: Medicare Other

## 2021-10-13 ENCOUNTER — Ambulatory Visit
Admission: RE | Admit: 2021-10-13 | Discharge: 2021-10-13 | Disposition: A | Discharge status: Home / Self Care | Payer: Medicare Other | Source: Ambulatory Visit | Attending: Orthopedic Surgery | Admitting: Orthopedic Surgery

## 2021-10-13 DIAGNOSIS — M2391 Unspecified internal derangement of right knee: Secondary | ICD-10-CM | POA: Diagnosis present

## 2021-10-13 DIAGNOSIS — M2351 Chronic instability of knee, right knee: Secondary | ICD-10-CM | POA: Insufficient documentation

## 2021-10-16 ENCOUNTER — Other Ambulatory Visit: Payer: Medicare Other

## 2021-10-16 ENCOUNTER — Ambulatory Visit
Admission: RE | Admit: 2021-10-16 | Discharge: 2021-10-16 | Disposition: A | Discharge status: Home / Self Care | Payer: Medicare Other | Source: Ambulatory Visit | Attending: Neurosurgery | Admitting: Neurosurgery

## 2021-10-16 DIAGNOSIS — G609 Hereditary and idiopathic neuropathy, unspecified: Secondary | ICD-10-CM | POA: Diagnosis present

## 2021-10-16 DIAGNOSIS — G959 Disease of spinal cord, unspecified: Secondary | ICD-10-CM | POA: Diagnosis present

## 2021-10-16 DIAGNOSIS — M5412 Radiculopathy, cervical region: Secondary | ICD-10-CM | POA: Insufficient documentation

## 2021-10-29 ENCOUNTER — Ambulatory Visit: Payer: Medicare Other | Admitting: Student in an Organized Health Care Education/Training Program

## 2022-08-01 ENCOUNTER — Emergency Department: Payer: Medicare Other

## 2022-08-01 ENCOUNTER — Other Ambulatory Visit: Payer: Self-pay

## 2022-08-01 ENCOUNTER — Encounter: Payer: Self-pay | Admitting: Emergency Medicine

## 2022-08-01 DIAGNOSIS — I10 Essential (primary) hypertension: Secondary | ICD-10-CM | POA: Diagnosis not present

## 2022-08-01 DIAGNOSIS — M25571 Pain in right ankle and joints of right foot: Secondary | ICD-10-CM | POA: Diagnosis present

## 2022-08-01 DIAGNOSIS — Y92812 Truck as the place of occurrence of the external cause: Secondary | ICD-10-CM | POA: Diagnosis not present

## 2022-08-01 DIAGNOSIS — W06XXXA Fall from bed, initial encounter: Secondary | ICD-10-CM | POA: Insufficient documentation

## 2022-08-01 NOTE — ED Provider Notes (Signed)
[facility] Provider Note    Event Date/Time   First MD Initiated Contact with Patient 08/01/22 2352     (approximate)   History   Fall and Ankle Injury   HPI Jake Richards is a 67 y.o. male who presents for evaluation of right ankle pain.  He reports that he was on a truck earlier in the day and fell getting off and somehow got his foot caught around the bed of the truck.  He has been ambulatory throughout the course the day but the pain has become more intense and he wanted to make sure nothing was broken.  He is able to support his own weight but it hurts to do so.  The pain seems to be primarily isolated to the right lateral part of his ankle.  No other injuries were sustained during his fall.     Physical Exam   Triage Vital Signs: ED Triage Vitals  Enc Vitals Group     BP 08/01/22 2313 (!) 180/97     Pulse Rate 08/01/22 2313 79     Resp 08/01/22 2313 17     Temp 08/01/22 2313 97.6 F (36.4 C)     Temp Source 08/01/22 2313 Oral     SpO2 08/01/22 2313 95 %     Weight 08/01/22 2316 91.2 kg (201 lb)     Height 08/01/22 2316 1.702 m ( )     Head Circumference --      Peak Flow --      Pain Score 08/01/22 2315 10     Pain Loc --      Pain Edu? --      Excl. in [place]? --     Most recent vital signs: Vitals:   08/01/22 2313  BP: (!) 180/97  Pulse: 79  Resp: 17  Temp: 97.6 F (36.4 C)  SpO2: 95%    General: Awake, no distress.  CV:  Good peripheral perfusion.  Resp:  Normal effort. Speaking easily and comfortably, no accessory muscle usage nor intercostal retractions.   Abd:  No distention.  Other:  There is no visible nor palpable abnormality to his right foot or ankle.  He is well-perfused with normal capillary refill.  He has some pain with manipulation of the ankle, primarily the lateral malleolus, but he is ambulatory with a limp.   ED Results / Procedures / Treatments   Labs (all labs ordered are listed, but only  abnormal results are displayed) Labs Reviewed - No data to display   RADIOLOGY I viewed and interpreted the patient's right ankle x-rays.  I see no evidence of fracture nor dislocation.  I also read the radiologist's report, which confirmed no acute findings.   PROCEDURES:  Critical Care performed: No  Procedures    IMPRESSION / MDM / ASSESSMENT AND PLAN / ED COURSE  I reviewed the triage vital signs and the nursing notes.                              Differential diagnosis includes, but is not limited to, ankle fracture, dislocation, sprain.  Patient's presentation is most consistent with acute complicated illness / injury requiring diagnostic workup.  Interventions/Medications given:  Medications - No data to display Right ankle x-ray series (Note:  hospital course my include additional interventions and/or labs/studies not listed above.)   Patient is hypertensive but this is likely situational.  No fracture or  dislocation on x-rays as documented above.  I gave the patient the option of crutches or Ace wrap and weightbearing as tolerated, and he preferred the Ace wrap and weightbearing as tolerated, declining the crutches.  He will follow-up as an outpatient.  I gave my usual and customary (RICE) management recommendations and return precautions.         FINAL CLINICAL IMPRESSION(S) / ED DIAGNOSES   Final diagnoses:  Acute right ankle pain  Uncontrolled hypertension     Rx / DC Orders   ED Discharge Orders     None        Note:  This document was prepared using Dragon voice recognition software and may include unintentional dictation errors.   Loleta Rose, MD 08/02/22 [phone]
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
## 2022-08-01 NOTE — ED Triage Notes (Signed)
Patient in via POV, reports falling out of the bed of a truck, ankle getting caught between tailgate and piece of equipment in bed of truck.  Incident happened around 1530 today; has since went about his day as normal but then pain increased significantly through the night.  Complains of right ankle pain, no obvious deformity noted at this time.    Patient denies hitting head, denies LOC.  NAD noted at this time.

## 2022-08-01 NOTE — Discharge Instructions (Addendum)
You have no fractures nor dislocations of your ankle. Please read through the included information, use the elastic wrap, and bear weight as tolerated.  Follow up with your regular doctor as needed.  You should also follow up with your primary care provider (PCP) about your high blood pressure.  It may just be elevated tonight because of the circumstances, but once everything has calmed down with your ankle, you should talk with your PCP about whether you should take blood pressure medication, or make adjustments to the medication regimen you are on.  Return to the Emergency Department with new or worsening symptoms that concern you.

## 2022-08-02 ENCOUNTER — Emergency Department
Admission: EM | Admit: 2022-08-02 | Discharge: 2022-08-02 | Disposition: A | Discharge status: Home / Self Care | Payer: Medicare Other | Attending: Emergency Medicine | Admitting: Emergency Medicine

## 2022-08-02 DIAGNOSIS — M25571 Pain in right ankle and joints of right foot: Secondary | ICD-10-CM

## 2022-08-02 DIAGNOSIS — I10 Essential (primary) hypertension: Secondary | ICD-10-CM

## 2022-08-16 ENCOUNTER — Ambulatory Visit
Admission: EM | Admit: 2022-08-16 | Discharge: 2022-08-16 | Disposition: A | Discharge status: Home / Self Care | Payer: Medicare Other

## 2022-08-24 ENCOUNTER — Emergency Department
Admission: EM | Admit: 2022-08-24 | Discharge: 2022-08-24 | Disposition: A | Discharge status: Home / Self Care | Payer: Medicare Other | Attending: Emergency Medicine | Admitting: Emergency Medicine

## 2022-08-24 ENCOUNTER — Other Ambulatory Visit: Payer: Self-pay

## 2022-08-24 DIAGNOSIS — G8929 Other chronic pain: Secondary | ICD-10-CM | POA: Diagnosis not present

## 2022-08-24 DIAGNOSIS — M25512 Pain in left shoulder: Secondary | ICD-10-CM | POA: Insufficient documentation

## 2022-08-24 MED ORDER — KETOROLAC TROMETHAMINE 15 MG/ML IJ SOLN
15.0000 mg | Freq: Once | INTRAMUSCULAR | Status: AC
  Administered 2022-08-24: 15 mg via INTRAMUSCULAR
  Filled 2022-08-24: qty 1
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
## 2022-08-24 NOTE — ED Triage Notes (Signed)
ARrives.  States injured right shoulder in April and returns with persistent pain.  Has followed up with ortho who prescribed steroids without improvement.

## 2022-08-24 NOTE — Discharge Instructions (Signed)
You may wear the sling for extra support, the patient number to remove it several times a day to help reduce your risk for developing frozen shoulder.  Please follow-up with orthopedics.  Please return for any new, worsening, or change in symptoms or other concerns.  It was a pleasure caring for you today.

## 2022-08-24 NOTE — ED Notes (Signed)
Pt declined shoulder immobilizer. EDP made aware. Pt states it will hurt him worse and he will follow up with ortho.

## 2022-08-24 NOTE — ED Notes (Signed)
Dc instructions reviewed with pt. Pt will follow up with ortho. No questions or concerns. Ambulated out of ED>

## 2022-08-28 ENCOUNTER — Other Ambulatory Visit: Payer: Self-pay | Admitting: Student

## 2022-08-28 DIAGNOSIS — M67912 Unspecified disorder of synovium and tendon, left shoulder: Secondary | ICD-10-CM

## 2022-08-28 DIAGNOSIS — S42122A Displaced fracture of acromial process, left shoulder, initial encounter for closed fracture: Secondary | ICD-10-CM

## 2022-08-28 DIAGNOSIS — G8929 Other chronic pain: Secondary | ICD-10-CM

## 2022-08-30 ENCOUNTER — Ambulatory Visit
Admission: RE | Admit: 2022-08-30 | Discharge: 2022-08-30 | Disposition: A | Discharge status: Home / Self Care | Payer: Medicare Other | Source: Ambulatory Visit | Attending: Student | Admitting: Student

## 2022-08-30 DIAGNOSIS — S42122A Displaced fracture of acromial process, left shoulder, initial encounter for closed fracture: Secondary | ICD-10-CM

## 2022-08-30 DIAGNOSIS — M25512 Pain in left shoulder: Secondary | ICD-10-CM | POA: Insufficient documentation

## 2022-08-30 DIAGNOSIS — G8929 Other chronic pain: Secondary | ICD-10-CM | POA: Diagnosis present

## 2022-08-30 DIAGNOSIS — M67912 Unspecified disorder of synovium and tendon, left shoulder: Secondary | ICD-10-CM | POA: Diagnosis present

## 2022-09-15 ENCOUNTER — Other Ambulatory Visit: Payer: Self-pay | Admitting: Orthopedic Surgery

## 2022-09-15 NOTE — Discharge Instructions (Addendum)
Post-Op Instructions - Rotator Cuff Repair  1. Bracing: You will wear a shoulder immobilizer or sling for 6 weeks.   2. Driving: No driving for 3 weeks post-op. When driving, do not wear the immobilizer. Ideally, we recommend no driving for 6 weeks while sling is in place as one arm will be immobilized.   3. Activity: No active lifting for 2 months. Wrist, hand, and elbow motion only. Avoid lifting the upper arm away from the body except for hygiene. You are permitted to bend and straighten the elbow passively only (no active elbow motion). You may use your hand and wrist for typing, writing, and managing utensils (cutting food). Do not lift more than a coffee cup for 8 weeks.  When sleeping or resting, inclined positions (recliner chair or wedge pillow) and a pillow under the forearm for support may provide better comfort for up to 4 weeks.  Avoid long distance travel for 4 weeks.  Return to normal activities after rotator cuff repair repair normally takes 6 months on average. If rehab goes very well, may be able to do most activities at 4 months, except overhead or contact sports.  4. Physical Therapy: Begins 3-4 days after surgery, and proceed 1 time per week for the first 6 weeks, then 1-2 times per week from weeks 6-20 post-op.  5. Medications:  - You will be provided a prescription for narcotic pain medicine. After surgery, take 1-2 narcotic tablets every 4 hours if needed for severe pain.  - A prescription for anti-nausea medication will be provided in case the narcotic medicine causes nausea - take 1 tablet every 6 hours only if nauseated.   - Take tylenol 1000 mg (2 Extra Strength tablets or 3 regular strength) every 8 hours for pain.  May decrease or stop tylenol 5 days after surgery if you are having minimal pain. - Take ASA 325mg /day x 2 weeks to help prevent DVTs/PEs (blood clots).  - DO NOT take ANY nonsteroidal anti-inflammatory pain medications (Advil, Motrin, Ibuprofen, Aleve,  Naproxen, or Naprosyn). These medicines can inhibit healing of your shoulder repair.    If you are taking prescription medication for anxiety, depression, insomnia, muscle spasm, chronic pain, or for attention deficit disorder, you are advised that you are at a higher risk of adverse effects with use of narcotics post-op, including narcotic addiction/dependence, depressed breathing, death. If you use non-prescribed substances: alcohol, marijuana, cocaine, heroin, methamphetamines, etc., you are at a higher risk of adverse effects with use of narcotics post-op, including narcotic addiction/dependence, depressed breathing, death. You are advised that taking > 50 morphine milligram equivalents (MME) of narcotic pain medication per day results in twice the risk of overdose or death. For your prescription provided: oxycodone 5 mg - taking more than 6 tablets per day would result in > 50 morphine milligram equivalents (MME) of narcotic pain medication. Be advised that we will prescribe narcotics short-term, for acute post-operative pain only - 3 weeks for major operations such as shoulder repair/reconstruction surgeries.     6. Post-Op Appointment:  Your first post-op appointment will be 10-14 days post-op.  7. Work or School: For most, but not all procedures, we advise staying out of work or school for at least 1 to 2 weeks in order to recover from the stress of surgery and to allow time for healing.   If you need a work or school note this can be provided.   8. Smoking: If you are a smoker, you need to refrain from  smoking in the postoperative period. The nicotine in cigarettes will inhibit healing of your shoulder repair and decrease the chance of successful repair. Similarly, nicotine containing products (gum, patches) should be avoided.   Post-operative Brace: Apply and remove the brace you received as you were instructed to at the time of fitting and as described in detail as the brace's  instructions for use indicate.  Wear the brace for the period of time prescribed by your physician.  The brace can be cleaned with soap and water and allowed to air dry only.  Should the brace result in increased pain, decreased feeling (numbness/tingling), increased swelling or an overall worsening of your medical condition, please contact your doctor immediately.  If an emergency situation occurs as a result of wearing the brace after normal business hours, please dial 911 and seek immediate medical attention.  Let your doctor know if you have any further questions about the brace issued to you. Refer to the shoulder sling instructions for use if you have any questions regarding the correct fit of your shoulder sling.  [facility] Customer Care for Troubleshooting: [phone]  Video that illustrates how to properly use a shoulder sling: "Instructions for Proper Use of an Orthopaedic Sling" http://bass.com/        POLAR CARE INFORMATION  MassAdvertisement.it  How to use [employer] Polar Care [facility] Therapy System?  YouTube   ShippingScam.co.uk  OPERATING INSTRUCTIONS  Start the product With dry hands, connect the transformer to the electrical connection located on the top of the cooler. Next, plug the transformer into an appropriate electrical outlet. The unit will automatically start running at this point.  To stop the pump, disconnect electrical power.  Unplug to stop the product when not in use. Unplugging the Polar Care unit turns it off. Always unplug immediately after use. Never leave it plugged in while unattended. Remove pad.    FIRST ADD WATER TO FILL LINE, THEN ICE---Replace ice when existing ice is almost melted  1 Discuss Treatment with your Licensed Health Care Practitioner and Use Only as Prescribed 2 Apply Insulation Barrier & Cold Therapy Pad 3 Check for Moisture 4 Inspect Skin Regularly  Tips and Trouble Shooting Usage  Tips 1. Use cubed or chunked ice for optimal performance. 2. It is recommended to drain the Pad between uses. To drain the pad, hold the Pad upright with the hose pointed toward the ground. Depress the black plunger and allow water to drain out. 3. You may disconnect the Pad from the unit without removing the pad from the affected area by depressing the silver tabs on the hose coupling and gently pulling the hoses apart. The Pad and unit will seal itself and will not leak. Note: Some dripping during release is normal. 4. DO NOT RUN PUMP WITHOUT WATER! The pump in this unit is designed to run with water. Running the unit without water will cause permanent damage to the pump. 5. Unplug unit before removing lid.  TROUBLESHOOTING GUIDE Pump not running, Water not flowing to the pad, Pad is not getting cold 1. Make sure the transformer is plugged into the wall outlet. 2. Confirm that the ice and water are filled to the indicated levels. 3. Make sure there are no kinks in the pad. 4. Gently pull on the blue tube to make sure the tube/pad junction is straight. 5. Remove the pad from the treatment site and ll it while the pad is lying at; then reapply. 6. Confirm that the pad couplings are  securely attached to the unit. Listen for the double clicks (Figure 1) to confirm the pad couplings are securely attached.  Leaks    Note: Some condensation on the lines, controller, and pads is unavoidable, especially in warmer climates. 1. If using a [employer] Polar Care Cold Therapy unit with a detachable Cold Therapy Pad, and a leak exists (other than condensation on the lines) disconnect the pad couplings. Make sure the silver tabs on the couplings are depressed before reconnecting the pad to the pump hose; then confirm both sides of the coupling are properly clicked in. 2. If the coupling continues to leak or a leak is detected in the pad itself, stop using it and call [employer] Customer Care at [phone]  [phone].  Cleaning After use, empty and dry the unit with a soft cloth. Warm water and mild detergent may be used occasionally to clean the pump and tubes.  WARNING: The Polar Care Cube can be cold enough to cause serious injury, including full skin necrosis. Follow these Operating Instructions, and carefully read the Product Insert (see pouch on side of unit) and the Cold Therapy Pad Fitting Instructions (provided with each Cold Therapy Pad) prior to use.

## 2022-09-16 ENCOUNTER — Encounter: Payer: Self-pay | Admitting: Orthopedic Surgery

## 2022-09-16 NOTE — Anesthesia Preprocedure Evaluation (Addendum)
Anesthesia Evaluation  Patient identified by MRN, date of birth, ID band Patient awake    Reviewed: Allergy & Precautions, H&P , NPO status , Patient's Chart, lab work & pertinent test results  Airway Mallampati: II  TM Distance: >3 FB Neck ROM: Full    Dental no notable dental hx.    Pulmonary neg pulmonary ROS, sleep apnea    Pulmonary exam normal breath sounds clear to auscultation       Cardiovascular negative cardio ROS Normal cardiovascular exam Rhythm:Regular Rate:Normal     Neuro/Psych  Headaches PSYCHIATRIC DISORDERS  Depression Bipolar Disorder   negative neurological ROS     GI/Hepatic Neg liver ROS,GERD  ,,  Endo/Other  negative endocrine ROS    Renal/GU negative Renal ROS  negative genitourinary   Musculoskeletal  (+) Arthritis ,    Abdominal   Peds negative pediatric ROS (+)  Hematology negative hematology ROS (+)   Anesthesia Other Findings   Reproductive/Obstetrics negative OB ROS                             Anesthesia Physical Anesthesia Plan  ASA: 3  Anesthesia Plan:    Post-op Pain Management:    Induction: Intravenous  PONV Risk Score and Plan:   Airway Management Planned: LMA  Additional Equipment:   Intra-op Plan:   Post-operative Plan: Extubation in OR  Informed Consent: I have reviewed the patients History and Physical, chart, labs and discussed the procedure including the risks, benefits and alternatives for the proposed anesthesia with the patient or authorized representative who has indicated his/her understanding and acceptance.     Dental Advisory Given  Plan Discussed with: Anesthesiologist, CRNA and Surgeon  Anesthesia Plan Comments: (Patient consented for risks of anesthesia including but not limited to:  - adverse reactions to medications - damage to eyes, teeth, lips or other oral mucosa - nerve damage due to positioning  - sore  throat or hoarseness - Damage to heart, brain, nerves, lungs, other parts of body or loss of life  Patient voiced understanding.)        Anesthesia Quick Evaluation

## 2022-09-18 ENCOUNTER — Ambulatory Visit: Payer: Medicare Other | Admitting: Anesthesiology

## 2022-09-18 ENCOUNTER — Ambulatory Visit
Admission: RE | Admit: 2022-09-18 | Discharge: 2022-09-18 | Disposition: A | Discharge status: Home / Self Care | Payer: Medicare Other | Attending: Orthopedic Surgery | Admitting: Orthopedic Surgery

## 2022-09-18 ENCOUNTER — Encounter
Admission: RE | Disposition: A | Discharge status: Home / Self Care | Payer: Self-pay | Source: Home / Self Care | Attending: Orthopedic Surgery

## 2022-09-18 ENCOUNTER — Encounter: Payer: Self-pay | Admitting: Orthopedic Surgery

## 2022-09-18 ENCOUNTER — Other Ambulatory Visit: Payer: Self-pay

## 2022-09-18 DIAGNOSIS — M75102 Unspecified rotator cuff tear or rupture of left shoulder, not specified as traumatic: Secondary | ICD-10-CM | POA: Diagnosis not present

## 2022-09-18 DIAGNOSIS — M19012 Primary osteoarthritis, left shoulder: Secondary | ICD-10-CM | POA: Diagnosis present

## 2022-09-18 DIAGNOSIS — M25812 Other specified joint disorders, left shoulder: Secondary | ICD-10-CM | POA: Insufficient documentation

## 2022-09-18 SURGERY — SHOULDER ARTHROSCOPY WITH SUBACROMIAL DECOMPRESSION AND DISTAL CLAVICLE EXCISION
Anesthesia: General | Site: Shoulder | Laterality: Left

## 2022-09-18 MED ORDER — EPHEDRINE SULFATE (PRESSORS) 50 MG/ML IJ SOLN
INTRAMUSCULAR | Status: DC | PRN
  Administered 2022-09-18: 5 mg via INTRAVENOUS
  Administered 2022-09-18: 10 mg via INTRAVENOUS

## 2022-09-18 MED ORDER — ONDANSETRON 4 MG PO TBDP: 4.0000 mg | ORAL_TABLET | Freq: Three times a day (TID) | ORAL | 0 refills | Status: DC | PRN

## 2022-09-18 MED ORDER — ACETAMINOPHEN 500 MG PO TABS: 1000.0000 mg | ORAL_TABLET | Freq: Three times a day (TID) | ORAL | 2 refills | Status: AC

## 2022-09-18 MED ORDER — ONDANSETRON HCL 4 MG/2ML IJ SOLN
INTRAMUSCULAR | Status: DC | PRN
  Administered 2022-09-18: 4 mg via INTRAVENOUS

## 2022-09-18 MED ORDER — CEFAZOLIN SODIUM-DEXTROSE 2-3 GM-%(50ML) IV SOLR
INTRAVENOUS | Status: DC | PRN
  Administered 2022-09-18: 2 g via INTRAVENOUS

## 2022-09-18 MED ORDER — LIDOCAINE HCL (CARDIAC) PF 100 MG/5ML IV SOSY
PREFILLED_SYRINGE | INTRAVENOUS | Status: DC | PRN
  Administered 2022-09-18: 100 mg via INTRAVENOUS

## 2022-09-18 MED ORDER — LACTATED RINGERS IV SOLN: INTRAVENOUS | Status: DC

## 2022-09-18 MED ORDER — DEXAMETHASONE SODIUM PHOSPHATE 10 MG/ML IJ SOLN
INTRAMUSCULAR | Status: DC | PRN
  Administered 2022-09-18: 4 mg via INTRAVENOUS

## 2022-09-18 MED ORDER — MIDAZOLAM HCL 2 MG/2ML IJ SOLN
INTRAMUSCULAR | Status: DC | PRN
  Administered 2022-09-18: 1 mg via INTRAVENOUS
  Administered 2022-09-18: 2 mg via INTRAVENOUS
  Administered 2022-09-18: 1 mg via INTRAVENOUS

## 2022-09-18 MED ORDER — ASPIRIN 325 MG PO TBEC: 325.0000 mg | DELAYED_RELEASE_TABLET | Freq: Every day | ORAL | 0 refills | Status: AC

## 2022-09-18 MED ORDER — BUPIVACAINE HCL (PF) 0.25 % IJ SOLN
INTRAMUSCULAR | Status: DC | PRN
  Administered 2022-09-18: 30 mL via PERINEURAL

## 2022-09-18 MED ORDER — DEXMEDETOMIDINE HCL IN NACL 200 MCG/50ML IV SOLN
INTRAVENOUS | Status: DC | PRN
  Administered 2022-09-18: 8 ug via INTRAVENOUS
  Administered 2022-09-18: 4 ug via INTRAVENOUS
  Administered 2022-09-18: 8 ug via INTRAVENOUS

## 2022-09-18 MED ORDER — PROPOFOL 10 MG/ML IV BOLUS
INTRAVENOUS | Status: DC | PRN
  Administered 2022-09-18: 150 mg via INTRAVENOUS

## 2022-09-18 MED ORDER — LACTATED RINGERS IV SOLN
INTRAVENOUS | Status: DC | PRN
  Administered 2022-09-18 (×4): 3001 mL

## 2022-09-18 MED ORDER — BUPIVACAINE LIPOSOME 1.3 % IJ SUSP
INTRAMUSCULAR | Status: DC | PRN
  Administered 2022-09-18: 10 mL via PERINEURAL

## 2022-09-18 MED ORDER — PHENYLEPHRINE HCL (PRESSORS) 10 MG/ML IV SOLN
INTRAVENOUS | Status: DC | PRN
  Administered 2022-09-18: 50 ug via INTRAVENOUS
  Administered 2022-09-18 (×2): 100 ug via INTRAVENOUS

## 2022-09-18 MED ORDER — LACTATED RINGERS IR SOLN
Status: DC | PRN
  Administered 2022-09-18 (×14): 3000 mL

## 2022-09-18 MED ORDER — OXYCODONE HCL 5 MG PO TABS: 5.0000 mg | ORAL_TABLET | ORAL | 0 refills | Status: DC | PRN

## 2022-09-18 SURGICAL SUPPLY — 64 items
ADH SKN CLS APL DERMABOND .7 (GAUZE/BANDAGES/DRESSINGS)
ADPR IRR PORT MULTIBAG TUBE (MISCELLANEOUS) ×1
ANCH SUT 2 SWLK 19.1 CLS EYLT (Anchor) ×2 IMPLANT
ANCH SUT 2.9 PUSHLOCK ANCH (Orthopedic Implant) ×1 IMPLANT
ANCH SUT SWLK 19.1X4.75 (Anchor) ×2 IMPLANT
ANCHOR SUT BIO SW 4.75X19.1 (Anchor) IMPLANT
ANCHOR SWIVELOCK BIO 4.75X19.1 (Anchor) IMPLANT
APL PRP STRL LF DISP 70% ISPRP (MISCELLANEOUS) ×1
BLADE SHAVER 4.5X7 STR FR (MISCELLANEOUS) ×1 IMPLANT
BUR BR 5.5 WIDE MOUTH (BURR) ×1 IMPLANT
CANNULA PART THRD DISP 5.75X7 (CANNULA) IMPLANT
CANNULA PARTIAL THREAD 2X7 (CANNULA) IMPLANT
CANNULA TWIST IN 8.25X7CM (CANNULA) IMPLANT
CHLORAPREP W/TINT 26 (MISCELLANEOUS) ×1 IMPLANT
COOLER POLAR GLACIER W/PUMP (MISCELLANEOUS) ×1 IMPLANT
COVER LIGHT HANDLE UNIVERSAL (MISCELLANEOUS) ×2 IMPLANT
DERMABOND ADVANCED .7 DNX12 (GAUZE/BANDAGES/DRESSINGS) IMPLANT
DRAPE INCISE IOBAN 66X45 STRL (DRAPES) ×1 IMPLANT
DRAPE STERI 35X30 U-POUCH (DRAPES) ×1 IMPLANT
DRAPE U-SHAPE 48X52 POLY STRL (PACKS) ×1 IMPLANT
DRSG TEGADERM 4X4.75 (GAUZE/BANDAGES/DRESSINGS) ×3 IMPLANT
ELECT REM PT RETURN 9FT ADLT (ELECTROSURGICAL)
ELECTRODE REM PT RTRN 9FT ADLT (ELECTROSURGICAL) IMPLANT
GAUZE SPONGE 4X4 12PLY STRL (GAUZE/BANDAGES/DRESSINGS) ×1 IMPLANT
GAUZE XEROFORM 1X8 LF (GAUZE/BANDAGES/DRESSINGS) ×1 IMPLANT
GLOVE SRG 8 PF TXTR STRL LF DI (GLOVE) ×3 IMPLANT
GLOVE SURG ENC MOIS LTX SZ7.5 (GLOVE) ×4 IMPLANT
GLOVE SURG UNDER POLY LF SZ8 (GLOVE) ×3
GOWN STRL REIN 2XL XLG LVL4 (GOWN DISPOSABLE) ×1 IMPLANT
GOWN STRL REUS W/ TWL LRG LVL3 (GOWN DISPOSABLE) ×3 IMPLANT
GOWN STRL REUS W/TWL LRG LVL3 (GOWN DISPOSABLE) ×3
IV LACTATED RINGER IRRG 3000ML (IV SOLUTION) ×4
IV LR IRRIG 3000ML ARTHROMATIC (IV SOLUTION) ×4 IMPLANT
KIT STABILIZATION SHOULDER (MISCELLANEOUS) ×1 IMPLANT
KIT TURNOVER KIT A (KITS) ×1 IMPLANT
LASSO 25 DEG RIGHT QUICKPASS (SUTURE) IMPLANT
MANIFOLD NEPTUNE II (INSTRUMENTS) ×1 IMPLANT
MASK FACE SPIDER DISP (MASK) ×1 IMPLANT
MAT GRAY ABSORB FLUID 28X50 (MISCELLANEOUS) ×2 IMPLANT
PACK ARTHROSCOPY SHOULDER (MISCELLANEOUS) ×1 IMPLANT
PAD ABD DERMACEA PRESS 5X9 (GAUZE/BANDAGES/DRESSINGS) ×2 IMPLANT
PAD WRAPON POLAR SHDR XLG (MISCELLANEOUS) ×1 IMPLANT
PASSER SUT FIRSTPASS SELF (INSTRUMENTS) IMPLANT
SET Y ADAPTER MULIT-BAG IRRIG (MISCELLANEOUS) ×1 IMPLANT
SPONGE T-LAP 18X18 ~~LOC~~+RFID (SPONGE) ×1 IMPLANT
SUT ETHILON 3-0 FS-10 30 BLK (SUTURE) ×1
SUT LASSO 90 DEG CVD (SUTURE) IMPLANT
SUT MNCRL 4-0 (SUTURE)
SUT MNCRL 4-0 27XMFL (SUTURE)
SUT NYLON 3 0 (SUTURE) IMPLANT
SUT VIC AB 0 CT1 36 (SUTURE) ×1 IMPLANT
SUT VIC AB 2-0 CT2 27 (SUTURE) IMPLANT
SUTURE EHLN 3-0 FS-10 30 BLK (SUTURE) ×1 IMPLANT
SUTURE MNCRL 4-0 27XMF (SUTURE) IMPLANT
SUTURE TAPE 1.3 40 TPR END (SUTURE) IMPLANT
SUTURETAPE 1.3 40 TPR END (SUTURE) ×1
SUTURETAPE 1.3 40 W/NDL BLK/WH (SUTURE) IMPLANT
SYSTEM IMPL TENODESIS LNT 2.9 (Orthopedic Implant) IMPLANT
TAPE MICROFOAM 4IN (TAPE) ×1 IMPLANT
TUBING CONNECTING 10 (TUBING) ×1 IMPLANT
TUBING INFLOW SET DBFLO PUMP (TUBING) ×1 IMPLANT
TUBING OUTFLOW SET DBLFO PUMP (TUBING) ×1 IMPLANT
WAND WEREWOLF FLOW 90D (MISCELLANEOUS) ×1 IMPLANT
WRAPON POLAR PAD SHDR XLG (MISCELLANEOUS) ×1

## 2022-09-18 NOTE — Progress Notes (Signed)
Assisted [facility] ANMD with left, interscalene , ultrasound guided block. Side rails up, monitors on throughout procedure. See vital signs in flow sheet. Tolerated Procedure well.

## 2022-09-18 NOTE — Transfer of Care (Signed)
Immediate Anesthesia Transfer of Care Note  Patient: Jake Richards  Procedure(s) Performed: Left shoulder arthroscopic rotator cuff repair, subacromial decompression, distal clavicle excision, and biceps tenodesis. (Left: Shoulder)  Patient Location: PACU  Anesthesia Type:General  Level of Consciousness: sedated  Airway & Oxygen Therapy: Patient Spontanous Breathing and Patient connected to face mask oxygen  Post-op Assessment: Report given to RN and Post -op Vital signs reviewed and stable  Post vital signs: Reviewed and stable  Last Vitals: See PACU flow sheet for temp Vitals Value Taken Time  BP 133/82 09/18/22 1108  Temp    Pulse 77 09/18/22 1110  Resp 16 09/18/22 1110  SpO2 98 % 09/18/22 1110  Vitals shown include unvalidated device data.  Last Pain:  Vitals:   09/18/22 0641  TempSrc: Temporal  PainSc: 10-Worst pain ever      Patients Stated Pain Goal: 0 (09/18/22 0641)  Complications: No notable events documented.

## 2022-09-18 NOTE — Op Note (Addendum)
SURGERY DATE: 09/18/2022   PRE-OP DIAGNOSIS:  1. Left rotator cuff tear 2. Left subacromial impingement 3. Left biceps tendinopathy 4. Left acromioclavicular joint arthritis   POST-OP DIAGNOSIS: 1. Left rotator cuff tear 2. Left subacromial impingement 3. Left biceps tendinopathy 4. Left acromioclavicular joint arthritis   PROCEDURES:  1. Left arthroscopic rotator cuff repair (supraspinatus musculotendinous junction) 2. Left arthroscopic biceps tenodesis 3. Left arthroscopic extensive debridement of shoulder (glenohumeral and subacromial spaces) 4. Left arthroscopic subacromial decompression 5. Left arthroscopic distal clavicle excision   SURGEON: Rosealee Albee, MD   ASSISTANT: Sonny Dandy, PA, Mason Minor, PA-S    ANESTHESIA: Gen with Exparel interscalene block   ESTIMATED BLOOD LOSS: minimal   DRAINS:  none   TOTAL IV FLUIDS: per anesthesia      SPECIMENS: none   IMPLANTS: - Arthrex 4.16mm SwiveLock x 4 - Arthrex 2.60mm PushLock anchor x 1     OPERATIVE FINDINGS:  Examination under anesthesia: A careful examination under anesthesia was performed.  Passive range of motion was: FF: 150; ER at side: 45; ER in abduction: 90; IR in abduction: 50.  Anterior load shift: NT.  Posterior load shift: NT.  Sulcus in neutral: NT.  Sulcus in ER: NT.     Intra-operative findings: A thorough arthroscopic examination of the shoulder was performed.  The findings are: 1. Biceps tendon: significant tendinopathy 2. Superior labrum: injected with surrounding synovitis 3. Posterior labrum and capsule: normal 4. Inferior capsule and inferior recess: normal 5. Glenoid cartilage surface: Normal 6. Supraspinatus attachment: full-thickness tear at the musculotendinous junction 7. Posterior rotator cuff attachment: Mild degenerative fraying 8. Humeral head articular cartilage: normal 9. Rotator interval: significant synovitis 10: Subscapularis tendon: Normal 11. Anterior labrum:  degenerative 12. IGHL: normal   OPERATIVE REPORT:    Indications for procedure:  Jake Richards is a 67 y.o. male with an approximately 6 weeks of shoulder pain after a fall. He has had difficulty lifting his arm over his head since that time. Clinical exam and MRI were suggestive of rotator cuff tear; subacromial impingement; AC joint arthritis; and biceps tendinopathy. Given these findings, we decided to proceed with surgical management. After discussion of risks, benefits, and alternatives to surgery, the patient elected to proceed.    Procedure in detail:   I identified Jake Richards in the pre-operative holding area.  I marked the operative shoulder with my initials. I reviewed the risks and benefits of the proposed surgical intervention, and the patient (and/or patient's guardian) wished to proceed.  Anesthesia was then performed with an Exparel interscalene block.  The patient was transferred to the operative suite and placed in the beach chair position.     SCDs were placed on the lower extremities. Appropriate IV antibiotics were administered prior to incision. The operative upper extremity was then prepped and draped in standard fashion. A time out was performed confirming the correct extremity, correct patient, and correct procedure.    I then created a standard posterior portal with an 11 blade. The glenohumeral joint was easily entered with a blunt trocar and the arthroscope introduced. The findings of diagnostic arthroscopy are described above.  A standard anterior portal was made.  I debrided degenerative tissue including the synovitic tissue about the rotator interval as well as the superior labrum and posterior labrum.  This was performed with an oscillating shaver. I then coagulated the inflamed synovium to obtain hemostasis and reduce the risk of post-operative swelling using an Arthrocare radiofrequency device.  I  then turned my attention to the arthroscopic biceps  tenodesis. The Loop n Tack technique was used to pass a FiberTape through the biceps in a locked fashion adjacent to the biceps anchor.  A hole for a 2.9 mm Arthrex PushLock was drilled in the bicipital groove just superior to the subscapularis tendon insertion.  The biceps tendon was then cut and the biceps anchor complex was debrided down to a stable base on the superior labrum.  The FiberTape was loaded onto the PushLock anchor and impacted into place into the previously drilled hole in the bicipital groove.  This appropriately secured the biceps into the bicipital groove and took it off of tension.   Next, the arthroscope was then introduced into the subacromial space.  An extensive subacromial bursectomy and debridement was performed using a combination of the shaver and Arthrocare wand. The entire acromial undersurface was exposed and the CA ligament was subperiosteally elevated to expose the anterior acromial hook. A 5.46mm barrel burr was used to create a flat anterior and lateral aspect of the acromion, converting it from a Type 2 to a Type 1 acromion. Care was made to keep the deltoid fascia intact.   I then turned my attention to the arthroscopic distal clavicle excision. I identified the acromioclavicular joint. Surrounding bursal tissue was debrided and the edges of the joint were identified. I used the 5.16mm barrel burr to remove the distal clavicle parallel to the edge of the acromion. I was able to fit two widths of the burr into the space between the distal clavicle and acromion, signifying that I had removed ~22mm of distal clavicle. This was confirmed by viewing anteriorly and introducing a probe with measuring marks from the lateral portal. Hemostasis was achieved with an Arthrocare wand.   Next, I created an accessory posterolateral portal to assist with visualization and instrumentation.  I debrided the poor quality edges of the supraspinatus tendon.  This was a tear at the  musculotendinous junction at the level of the articular margin.  There is good-quality remnant tendon covering the footprint with mild retraction of the medial stump.  This can be easily mobilized to the footprint. I prepared the footprint using an ArthroCare wand to clear it of soft tissue.     I then percutaneously placed 1 Arthrex SwiveLock C anchor double loaded with suture tape as a medial row anchor along the anterior portion of the tear at the articular margin. Another SwiveLock C anchor was placed along the posterior portion of the tear at the articular margin. I then shuttled all the strands of tape through the rotator cuff just lateral to the musculotendinous junction using a FirstPass suture passer spanning the anterior to posterior extent of the tear.  The repair stitches from each anchor were passed in a side-to-side fashion.  First pass was used to pass through the medial stump and a suture lasso was used to pass the other strand through the medial stump.  Next, the posterior strands of each suture were passed through an [employer] anchor.  This was placed approximately 2 cm distal to the lateral edge of the footprint in line with the posterior aspect of the tear with appropriate tensioning of each suture prior to final fixation.  Similarly, the anterior strands of each suture were passed through another SwiveLock anchor along the anterior margin of the tear.  The repair sutures from both SwiveLock anchors were then tied arthroscopically further reducing the tear.  This construct allowed for excellent reapproximation  of the medial stump to the remnant lateral tendon at the level of the articular margin.  To further reinforce the repair, the knotless mechanism of the posterior SwiveLock anchor was utilized to pass the stitch across the central portion of the tear with a suture lasso, and it was tensioned down appropriately.  Furthermore, a separate side-to-side stitch was passed across the tear  and tied arthroscopically using a suture lasso.  This construct allowed for excellent reduction of the rotator cuff, and appropriate compression was achieved.  The repair was stable to external and internal rotation.   Fluid was evacuated from the shoulder, and the portals were closed with 3-0 Nylon. Xeroform was applied to the portals. A sterile dressing was applied, followed by a Polar Care sleeve and a SlingShot shoulder immobilizer/sling. The patient was awakened from anesthesia without difficulty and was transferred to the PACU in stable condition.   Of note, assistance from a PA was essential to performing the surgery.  PA was present for the entire surgery.  PA assisted with patient positioning, retraction, instrumentation, and wound closure. The surgery would have been more difficult and had longer operative time without PA assistance.    Initially, there was significantly increased complexity to the surgery due to the pattern of the supraspinatus tear.  It was located at the musculotendinous junction.  This necessitated multiple side-to-side stitches in addition to standard rotator cuff repair.  This tear pattern at at least 30 minutes to the surgical time compared to standard arthroscopic rotator cuff repair.     COMPLICATIONS: none   DISPOSITION: plan for discharge home after recovery in PACU     POSTOPERATIVE PLAN: Remain in sling (except hygiene and elbow/wrist/hand RoM exercises as instructed by PT) x 6 weeks and NWB for this time. PT to begin 3-4 days after surgery.  Use large rotator cuff repair rehab protocol.  ASA 325mg  daily x 2 weeks for DVT ppx.

## 2022-09-18 NOTE — Anesthesia Procedure Notes (Signed)
Anesthesia Regional Block: Interscalene brachial plexus block   Pre-Anesthetic Checklist: , timeout performed,  Correct Patient, Correct Site, Correct Laterality,  Correct Procedure, Correct Position, site marked,  Risks and benefits discussed,  Surgical consent,  Pre-op evaluation,  At surgeon's request and post-op pain management  Laterality: Left  Prep: chloraprep       Needles:  Injection technique: Single-shot  Needle Type: Stimiplex     Needle Length: 10cm  Needle Gauge: 21     Additional Needles:   Procedures:,,,, ultrasound used (permanent image in chart),,    Narrative:  Start time: 09/18/2022 7:06 AM End time: 09/18/2022 7:19 AM Injection made incrementally with aspirations every 5 mL.  Performed by: Personally  Anesthesiologist: Marisue Humble, MD  Additional Notes: Functioning IV was confirmed and monitors applied. Ultrasound guidance: relevant anatomy identified, needle position confirmed, local anesthetic spread visualized around nerve(s)., vascular puncture avoided.  Image printed for medical record.  Negative aspiration and no paresthesias; incremental administration of local anesthetic. The patient tolerated the procedure well. Vitals signes recorded in RN notes.  Also placed superficial cervical plexus block and intercostobrachial block.

## 2022-09-18 NOTE — Anesthesia Postprocedure Evaluation (Signed)
Anesthesia Post Note  Patient: Doyt Hetzel  Procedure(s) Performed: Left shoulder arthroscopic rotator cuff repair, subacromial decompression, distal clavicle excision, and biceps tenodesis. (Left: Shoulder)  Patient location during evaluation: PACU Anesthesia Type: General Level of consciousness: awake and alert Pain management: pain level controlled Vital Signs Assessment: post-procedure vital signs reviewed and stable Respiratory status: spontaneous breathing, nonlabored ventilation, respiratory function stable and patient connected to nasal cannula oxygen Cardiovascular status: blood pressure returned to baseline and stable Postop Assessment: no apparent nausea or vomiting Anesthetic complications: no   No notable events documented.   Last Vitals:  Vitals:   09/18/22 1130 09/18/22 1145  BP: (!) 126/91 118/86  Pulse: 82 80  Resp: 11 19  Temp: (!) 36.2 C (!) 36.2 C  SpO2: 93% 93%    Last Pain:  Vitals:   09/18/22 1130  TempSrc:   PainSc: 0-No pain                 Jadeyn Hargett C Yakira Duquette

## 2022-09-18 NOTE — H&P (Signed)
Paper H&P to be scanned into permanent record. H&P reviewed. No significant changes noted.  

## 2022-09-18 NOTE — Anesthesia Procedure Notes (Signed)
Procedure Name: LMA Insertion Date/Time: 09/18/2022 7:41 AM  Performed by: Genia Del, CRNAPre-anesthesia Checklist: Patient identified, Patient being monitored, Timeout performed, Emergency Drugs available and Suction available Patient Re-evaluated:Patient Re-evaluated prior to induction Oxygen Delivery Method: Circle system utilized Preoxygenation: Pre-oxygenation with 100% oxygen Induction Type: IV induction Ventilation: Mask ventilation without difficulty LMA: LMA inserted LMA Size: 5.0 Tube type: Oral Number of attempts: 1 Placement Confirmation: positive ETCO2 and breath sounds checked- equal and bilateral Tube secured with: Tape Dental Injury: Teeth and Oropharynx as per pre-operative assessment  Comments: Ambu Auraonce LMA inserted with positive = BBS and ETC02.  Large tongue displaced with tongue blade and required reattempt with LMA upside down and flipped into proper seating position with good seal.

## 2022-09-28 ENCOUNTER — Encounter: Payer: Self-pay | Admitting: Orthopedic Surgery

## 2022-09-28 NOTE — Addendum Note (Signed)
Addendum  created 09/28/22 0905 by Genia Del, CRNA   Intraprocedure Event edited, Intraprocedure Staff edited

## 2022-11-09 ENCOUNTER — Other Ambulatory Visit: Payer: Self-pay | Admitting: Neurology

## 2022-11-09 DIAGNOSIS — G609 Hereditary and idiopathic neuropathy, unspecified: Secondary | ICD-10-CM

## 2022-11-16 ENCOUNTER — Ambulatory Visit
Admission: RE | Admit: 2022-11-16 | Discharge: 2022-11-16 | Disposition: A | Discharge status: Home / Self Care | Payer: Medicare Other | Source: Ambulatory Visit | Attending: Neurology | Admitting: Neurology

## 2022-11-16 DIAGNOSIS — G609 Hereditary and idiopathic neuropathy, unspecified: Secondary | ICD-10-CM | POA: Diagnosis present

## 2023-07-10 ENCOUNTER — Emergency Department

## 2023-07-10 ENCOUNTER — Encounter: Payer: Self-pay | Admitting: Emergency Medicine

## 2023-07-10 ENCOUNTER — Observation Stay
Admission: EM | Admit: 2023-07-10 | Discharge: 2023-07-11 | Disposition: A | Discharge status: Home Health | Attending: Internal Medicine | Admitting: Internal Medicine

## 2023-07-10 ENCOUNTER — Other Ambulatory Visit: Payer: Self-pay

## 2023-07-10 DIAGNOSIS — G609 Hereditary and idiopathic neuropathy, unspecified: Secondary | ICD-10-CM | POA: Diagnosis not present

## 2023-07-10 DIAGNOSIS — Z79899 Other long term (current) drug therapy: Secondary | ICD-10-CM | POA: Diagnosis not present

## 2023-07-10 DIAGNOSIS — E785 Hyperlipidemia, unspecified: Secondary | ICD-10-CM | POA: Insufficient documentation

## 2023-07-10 DIAGNOSIS — F17290 Nicotine dependence, other tobacco product, uncomplicated: Secondary | ICD-10-CM | POA: Insufficient documentation

## 2023-07-10 DIAGNOSIS — M79602 Pain in left arm: Secondary | ICD-10-CM | POA: Insufficient documentation

## 2023-07-10 DIAGNOSIS — K219 Gastro-esophageal reflux disease without esophagitis: Secondary | ICD-10-CM | POA: Diagnosis not present

## 2023-07-10 DIAGNOSIS — M75102 Unspecified rotator cuff tear or rupture of left shoulder, not specified as traumatic: Secondary | ICD-10-CM | POA: Diagnosis not present

## 2023-07-10 DIAGNOSIS — N529 Male erectile dysfunction, unspecified: Secondary | ICD-10-CM | POA: Diagnosis not present

## 2023-07-10 DIAGNOSIS — F17200 Nicotine dependence, unspecified, uncomplicated: Secondary | ICD-10-CM | POA: Insufficient documentation

## 2023-07-10 DIAGNOSIS — F32A Depression, unspecified: Secondary | ICD-10-CM | POA: Insufficient documentation

## 2023-07-10 DIAGNOSIS — R55 Syncope and collapse: Principal | ICD-10-CM | POA: Diagnosis present

## 2023-07-10 DIAGNOSIS — F319 Bipolar disorder, unspecified: Secondary | ICD-10-CM | POA: Insufficient documentation

## 2023-07-10 DIAGNOSIS — Z91040 Latex allergy status: Secondary | ICD-10-CM | POA: Insufficient documentation

## 2023-07-10 LAB — BASIC METABOLIC PANEL
Anion gap: 7 (ref 5–15)
BUN: 25 mg/dL — ABNORMAL HIGH (ref 8–23)
CO2: 22 mmol/L (ref 22–32)
Calcium: 9.4 mg/dL (ref 8.9–10.3)
Chloride: 103 mmol/L (ref 98–111)
Creatinine, Ser: 1.15 mg/dL (ref 0.61–1.24)
GFR, Estimated: >60 mL/min (ref >60)
Glucose, Bld: 108 mg/dL — ABNORMAL HIGH (ref 70–99)
Potassium: 4 mmol/L (ref 3.5–5.1)
Sodium: 132 mmol/L — ABNORMAL LOW (ref 135–145)

## 2023-07-10 LAB — URINALYSIS, ROUTINE W REFLEX MICROSCOPIC
Bilirubin Urine: NEGATIVE
Glucose, UA: NEGATIVE mg/dL
Hgb urine dipstick: NEGATIVE
Ketones, ur: NEGATIVE mg/dL
Leukocytes,Ua: NEGATIVE
Nitrite: NEGATIVE
Protein, ur: NEGATIVE mg/dL
Specific Gravity, Urine: 1.024 (ref 1.005–1.030)
pH: 5 (ref 5.0–8.0)

## 2023-07-10 LAB — ETHANOL: Alcohol, Ethyl (B): <10 mg/dL (ref <10)

## 2023-07-10 LAB — LIPASE, BLOOD: Lipase: 44 U/L (ref 11–51)

## 2023-07-10 LAB — HEPATIC FUNCTION PANEL
ALT: 40 U/L (ref 0–44)
AST: 32 U/L (ref 15–41)
Albumin: 4.7 g/dL (ref 3.5–5.0)
Alkaline Phosphatase: 90 U/L (ref 38–126)
Bilirubin, Direct: <0.1 mg/dL (ref 0.0–0.2)
Total Bilirubin: 0.8 mg/dL (ref 0.0–1.2)
Total Protein: 8.1 g/dL (ref 6.5–8.1)

## 2023-07-10 LAB — CBC
HCT: 41.5 % (ref 39.0–52.0)
Hemoglobin: 14.9 g/dL (ref 13.0–17.0)
MCH: 32.1 pg (ref 26.0–34.0)
MCHC: 35.9 g/dL (ref 30.0–36.0)
MCV: 89.4 fL (ref 80.0–100.0)
Platelets: 333 10*3/uL (ref 150–400)
RBC: 4.64 MIL/uL (ref 4.22–5.81)
RDW: 12.5 % (ref 11.5–15.5)
WBC: 8.4 10*3/uL (ref 4.0–10.5)
nRBC: 0 % (ref 0.0–0.2)

## 2023-07-10 LAB — TROPONIN I (HIGH SENSITIVITY)
Troponin I (High Sensitivity): 5 ng/L (ref <18)
Troponin I (High Sensitivity): 5 ng/L (ref <18)

## 2023-07-10 LAB — CK: Total CK: 366 U/L (ref 49–397)

## 2023-07-10 LAB — CBG MONITORING, ED: Glucose-Capillary: 95 mg/dL (ref 70–99)

## 2023-07-10 MED ORDER — SENNOSIDES-DOCUSATE SODIUM 8.6-50 MG PO TABS: 1.0000 | ORAL_TABLET | Freq: Every evening | ORAL | Status: DC | PRN

## 2023-07-10 MED ORDER — DULOXETINE HCL 30 MG PO CPEP
120.0000 mg | ORAL_CAPSULE | Freq: Every day | ORAL | Status: DC
  Administered 2023-07-11: 120 mg via ORAL
  Filled 2023-07-10: qty 4

## 2023-07-10 MED ORDER — KETOROLAC TROMETHAMINE 15 MG/ML IJ SOLN
15.0000 mg | Freq: Four times a day (QID) | INTRAMUSCULAR | Status: DC | PRN
  Administered 2023-07-11: 15 mg via INTRAVENOUS
  Filled 2023-07-10: qty 1

## 2023-07-10 MED ORDER — VITAMIN B-12 1000 MCG PO TABS
1000.0000 ug | ORAL_TABLET | Freq: Every day | ORAL | Status: DC
  Administered 2023-07-11: 1000 ug via ORAL
  Filled 2023-07-10: qty 1

## 2023-07-10 MED ORDER — MELATONIN 5 MG PO TABS
10.0000 mg | ORAL_TABLET | Freq: Every day | ORAL | Status: DC
  Administered 2023-07-10: 10 mg via ORAL
  Filled 2023-07-10: qty 2

## 2023-07-10 MED ORDER — HYDROMORPHONE HCL 1 MG/ML IJ SOLN
0.5000 mg | Freq: Once | INTRAMUSCULAR | Status: AC
  Administered 2023-07-10: 0.5 mg via INTRAVENOUS
  Filled 2023-07-10: qty 0.5

## 2023-07-10 MED ORDER — OXYCODONE HCL 5 MG PO TABS
5.0000 mg | ORAL_TABLET | Freq: Once | ORAL | Status: AC
  Administered 2023-07-10: 5 mg via ORAL
  Filled 2023-07-10: qty 1

## 2023-07-10 MED ORDER — VITAMIN D 25 MCG (1000 UNIT) PO TABS
5000.0000 [IU] | ORAL_TABLET | Freq: Every day | ORAL | Status: DC
  Administered 2023-07-11: 5000 [IU] via ORAL
  Filled 2023-07-10 (×2): qty 5

## 2023-07-10 MED ORDER — PANTOPRAZOLE SODIUM 40 MG PO TBEC
80.0000 mg | DELAYED_RELEASE_TABLET | Freq: Every day | ORAL | Status: DC
  Administered 2023-07-11: 80 mg via ORAL
  Filled 2023-07-10: qty 2

## 2023-07-10 MED ORDER — GABAPENTIN 300 MG PO CAPS
1200.0000 mg | ORAL_CAPSULE | Freq: Every day | ORAL | Status: DC
  Administered 2023-07-10: 1200 mg via ORAL
  Filled 2023-07-10: qty 4

## 2023-07-10 MED ORDER — RISPERIDONE 0.5 MG PO TABS
0.5000 mg | ORAL_TABLET | Freq: Every day | ORAL | Status: DC
  Administered 2023-07-10: 0.5 mg via ORAL
  Filled 2023-07-10: qty 1

## 2023-07-10 MED ORDER — ONDANSETRON HCL 4 MG PO TABS: 4.0000 mg | ORAL_TABLET | Freq: Four times a day (QID) | ORAL | Status: DC | PRN

## 2023-07-10 MED ORDER — LIDOCAINE 5 % EX PTCH
1.0000 | MEDICATED_PATCH | CUTANEOUS | Status: DC
  Administered 2023-07-10: 1 via TRANSDERMAL
  Filled 2023-07-10 (×2): qty 2

## 2023-07-10 MED ORDER — ONDANSETRON HCL 4 MG/2ML IJ SOLN: 4.0000 mg | Freq: Four times a day (QID) | INTRAMUSCULAR | Status: DC | PRN

## 2023-07-10 MED ORDER — ACETAMINOPHEN 650 MG RE SUPP: 650.0000 mg | Freq: Four times a day (QID) | RECTAL | Status: DC | PRN

## 2023-07-10 MED ORDER — LURASIDONE HCL 40 MG PO TABS
40.0000 mg | ORAL_TABLET | Freq: Every day | ORAL | Status: DC
  Administered 2023-07-10: 40 mg via ORAL
  Filled 2023-07-10: qty 1

## 2023-07-10 MED ORDER — CITALOPRAM HYDROBROMIDE 20 MG PO TABS
10.0000 mg | ORAL_TABLET | Freq: Every day | ORAL | Status: DC
  Administered 2023-07-11: 10 mg via ORAL
  Filled 2023-07-10: qty 1

## 2023-07-10 MED ORDER — MORPHINE SULFATE (PF) 4 MG/ML IV SOLN
4.0000 mg | INTRAVENOUS | Status: DC | PRN
  Administered 2023-07-11 (×2): 4 mg via INTRAVENOUS
  Filled 2023-07-10 (×2): qty 1

## 2023-07-10 MED ORDER — ONDANSETRON HCL 4 MG/2ML IJ SOLN
4.0000 mg | Freq: Once | INTRAMUSCULAR | Status: AC
  Administered 2023-07-10: 4 mg via INTRAVENOUS
  Filled 2023-07-10: qty 2

## 2023-07-10 MED ORDER — SODIUM CHLORIDE 0.9% FLUSH
3.0000 mL | Freq: Two times a day (BID) | INTRAVENOUS | Status: DC
  Administered 2023-07-10 – 2023-07-11 (×2): 3 mL via INTRAVENOUS

## 2023-07-10 MED ORDER — ACETAMINOPHEN 325 MG PO TABS: 650.0000 mg | ORAL_TABLET | Freq: Four times a day (QID) | ORAL | Status: DC | PRN

## 2023-07-10 MED ORDER — LAMOTRIGINE 100 MG PO TABS
100.0000 mg | ORAL_TABLET | Freq: Every day | ORAL | Status: DC
  Administered 2023-07-11: 100 mg via ORAL
  Filled 2023-07-10: qty 1

## 2023-07-10 MED ORDER — LAMOTRIGINE 25 MG PO TABS
25.0000 mg | ORAL_TABLET | Freq: Every day | ORAL | Status: DC
  Administered 2023-07-11: 25 mg via ORAL
  Filled 2023-07-10: qty 1

## 2023-07-10 MED ORDER — HEPARIN SODIUM (PORCINE) 5000 UNIT/ML IJ SOLN
5000.0000 [IU] | Freq: Three times a day (TID) | INTRAMUSCULAR | Status: DC
  Administered 2023-07-10 – 2023-07-11 (×3): 5000 [IU] via SUBCUTANEOUS
  Filled 2023-07-10 (×3): qty 1

## 2023-07-10 MED ORDER — CARBAMAZEPINE ER 100 MG PO TB12
100.0000 mg | ORAL_TABLET | Freq: Two times a day (BID) | ORAL | Status: DC
  Administered 2023-07-10 – 2023-07-11 (×2): 100 mg via ORAL
  Filled 2023-07-10 (×3): qty 1

## 2023-07-10 NOTE — Assessment & Plan Note (Addendum)
 Etiology workup in progress, differentials include stroke, vasovagal, cardiac etiology, infectious etiology, polypharmacy and with accidentally taking sildenafil in the AM We will continue to follow second high since troponin Complete echo ordered on admission UA ordered by EDP pending collection at the time of this dictation Fall precaution PT, OT for evaluation on 07/11/2023

## 2023-07-10 NOTE — ED Triage Notes (Addendum)
 Pt in via POV, reports syncopal episode at home, states, "I went to cut off the bathroom light and I then I woke up between the commode and the wall."  Denies any alarming symptoms prior.  Complaints of left rib pain w/ new onset shortness of breath since fall.  States he fell so hard that he moved the commode.  A/Ox4, vitals unremarkable, appears uncomfortable due to pain.

## 2023-07-10 NOTE — Assessment & Plan Note (Signed)
 Home Zanaflex not resumed on admission

## 2023-07-10 NOTE — ED Notes (Signed)
 Pt to CT

## 2023-07-10 NOTE — Assessment & Plan Note (Addendum)
 And left chest pain Worse with movement and laughter Suspect secondary to falling this AM Acetaminophen 650 mg p.o./rectal every 6 hours as needed for mild pain and fever; Toradol 50 mg IV every 6 hours as needed for moderate pain Lidocaine 1-2 patch applied to the left chest, 5 days ordered Morphine 4 mg IV every 4 hours as needed for severe pain, 20 hours of coverage ordered

## 2023-07-10 NOTE — Assessment & Plan Note (Signed)
 Home sildenafil not resumed on admission

## 2023-07-10 NOTE — ED Provider Notes (Signed)
 [facility] Provider Note    Event Date/Time   First MD Initiated Contact with Patient 07/10/23 1208     (approximate)   History   Loss of Consciousness   HPI  Jake Richards is a 68 y.o. male who comes in for syncopal episode.  Patient states that he was trying to light off in the bathroom when he woke up on the ground.  He does report some left-sided rib pain.  Patient states he woke up he felt his normal self.  He denies any shortness of breath, recent long travel recent surgeries, leg swelling.  Denies any chest pain.  Denies any abdominal pain or headaches.  He states that he just woke up and was on the ground.  Patient reports severe left-sided rib pain.  He states that he hit his side of his head and his ribs on the toilet and move the toilet.  Tdap 2021  Physical Exam   Triage Vital Signs: ED Triage Vitals  Encounter Vitals Group     BP 07/10/23 1200 (!) 131/91     Systolic BP Percentile --      Diastolic BP Percentile --      Pulse Rate 07/10/23 1200 (!) 105     Resp 07/10/23 1200 20     Temp 07/10/23 1200 (!) 97.4 F (36.3 C)     Temp Source 07/10/23 1200 Oral     SpO2 07/10/23 1200 100 %     Weight 07/10/23 1202 195 lb (88.5 kg)     Height 07/10/23 1202 5\' 7"  (1.702 m)     Head Circumference --      Peak Flow --      Pain Score 07/10/23 1201 10     Pain Loc --      Pain Education --      Exclude from Growth Chart --     Most recent vital signs: Vitals:   07/10/23 1200  BP: (!) 131/91  Pulse: (!) 105  Resp: 20  Temp: (!) 97.4 F (36.3 C)  SpO2: 100%     General: Awake, no distress.  CV:  Good peripheral perfusion.  Resp:  Normal effort.  Abd:  No distention.  Soft and nontender Other:  left-sided chest wall tenderness with small abrasion noted.  Breath sounds noted bilaterally   ED Results / Procedures / Treatments   Labs (all labs ordered are listed, but only abnormal results are displayed) Labs Reviewed   BASIC METABOLIC PANEL  CBC  URINALYSIS, ROUTINE W REFLEX MICROSCOPIC  HEPATIC FUNCTION PANEL  LIPASE, BLOOD  CK  ETHANOL  CBG MONITORING, ED  TROPONIN I (HIGH SENSITIVITY)     EKG  My interpretation of EKG:  Sinus tachycardia 112 without any ST elevation or T wave inversions with left bundle branch block  RADIOLOGY I have reviewed the xray personally and interpreted no obvious rib fracture   PROCEDURES:  Critical Care performed: No  .1-3 Lead EKG Interpretation  Performed by: Concha Se, MD Authorized by: Concha Se, MD     Interpretation: normal     ECG rate:  90   ECG rate assessment: normal     Rhythm: sinus rhythm     Ectopy: none     Conduction: normal      MEDICATIONS ORDERED IN ED: Medications  HYDROmorphone (DILAUDID) injection 0.5 mg (has no administration in time range)  ondansetron (ZOFRAN) injection 4 mg (has no administration in time range)  IMPRESSION / MDM / ASSESSMENT AND PLAN / ED COURSE  I reviewed the triage vital signs and the nursing notes.   Patient's presentation is most consistent with acute presentation with potential threat to life or bodily function.   Patient comes in with syncopal episode.  This is concerning given he had no prodromal symptoms I am concerned about potential cardiac cause.  No abdominal pain to suggest aneurysm rupture or injury from the fall.  No symptoms of shortness of breath or risk factors for PE to suggest pulmonary embolism patient is not hypoxic.  I suspect patient has rib fractures and he was slightly tachycardic but I suspect that was related to pain.  Patient was given some Dilaudid, Zofran.  Troponin negative CK normal, creatinine normal.  Patient improving with medications.  Will give a dose of oxycodone.  X-ray overall reassuring.  CT head negative per my interpretation.  I will discuss with hospitalist for admission for syncopal workup  The patient is on the cardiac monitor to evaluate for  evidence of arrhythmia and/or significant heart rate changes.      FINAL CLINICAL IMPRESSION(S) / ED DIAGNOSES   Final diagnoses:  Syncope and collapse     Rx / DC Orders   ED Discharge Orders     None        Note:  This document was prepared using Dragon voice recognition software and may include unintentional dictation errors.   Concha Se, MD 07/10/23 [phone]

## 2023-07-10 NOTE — H&P (Signed)
 History and Physical   Jake Richards AOZ:[id number] DOB: [birth year] DOA: 07/10/2023  PCP: Dione Housekeeper, MD  Outpatient Specialists: Dr. Cristopher Peru, neurology Patient coming from: home via POV  I have personally briefly reviewed patient's old medical records in [facility] EMR.  Chief Concern: syncope  HPI: [name] is a 76 male with history of ED on sildenafil, GERD, inflammatory neuropathy, idiopathic peripheral neuropathy, depression, anxiety, LBBB, bipolar affective disorder, glucose intolerance, who presents to the ED for chief concern of syncope.  Vitals in the ED showed temperature of 97.4, respiration rate 20, heart rate 105, blood pressure 131/91, SpO2 100% on room air.  Serum sodium is 132, potassium 4.0, chloride 103, bicarb 22, BUN of 25, serum creatinine 1.15, EGFR greater than 60, nonfasting blood glucose 108, WBC 8.4, hemoglobin 14.9, platelets of 333.  High-sensitivity troponin is 5.  CK is 366.  CT cervical spine wo cm: no acute fracture of cervical spine.  CT head wo CM: no CT evidence of acute intracranial abnormality.  Left ribs with cxr: Read as no acute displaced rib fracture.  Patchy left retrocardiac opacity, likely atelectasis.  ED treatment: Dilaudid 0.5 mg IV one-time dose, ondansetron 4 mg IV one-time dose.  Hospitalist admission requested for syncope. --------------------------------------- At bedside, patient is able to tell me his first and last name, age, location, current calendar year.   He reports that at approximately 10 am today, he was sitting watching TV, when he felt the need to urinate. He got up, walked to the restroom to switch on the light and the next thing he knew, he was laying between the wall and the toilet. He fell so hard that he caused the toilet to displaced from position.  He woke up about 2-3 times laying on the floor however, felt so weak that he was not able to get himself up. He did not urinate on himself  during the episode. He endorsed some nausea and denies vomiting.  Ultimately, he was able to get himself up and drove himself to the ED.  Prior to passing out, he denies chest pain, dyspnea, abdominal pain, diarrhea, fever, chills, nausea, vomiting.   He reports recent stressors recently, as his wife of 5 years said she wanted a divorce last month on their 5 year anniversary.   He took a sildenafil last evening (3/21) around 9/10 pm as he was anticipating sexual activity with his estranged wife, however they did not have intercourse as he did not go back to their marital home.  He states he took his AM medications this morning around 9a/9:30a prior to passing out (~ 10am). He states all his medications are in a bag together. He states he did not think he took a sildenafil this morning, however is not sure as all his medications are stored in one bag.  He reports a new addition to his bipolar medication that was started recently and he does not know the name of the medication. He states it was an evening medication that was added.  Social history: He is currently separated from his spouse of 5 years.  He currently lives in an attic apartment.  He currently vapes. He denies etoh and recreational drugs.  ROS: Constitutional: no weight change, no fever ENT/Mouth: no sore throat, no rhinorrhea Eyes: no eye pain, no vision changes Cardiovascular: no chest pain, no dyspnea,  no edema, no palpitations Respiratory: no cough, no sputum, no wheezing Gastrointestinal: no nausea, no vomiting, no diarrhea, no constipation  Genitourinary: no urinary incontinence, no dysuria, no hematuria Musculoskeletal: no arthralgias, no myalgias Skin: no skin lesions, no pruritus, Neuro: + weakness, no loss of consciousness, no syncope Psych: no anxiety, no depression, + decrease appetite Heme/Lymph: no bruising, no bleeding  ED Course: Discussed with EDP, patient requiring hospitalization for chief concerns of  syncope.  Assessment/Plan  Principal Problem:   Syncope Active Problems:   Syncope and collapse   HLD (hyperlipidemia)   Erectile dysfunction   Idiopathic peripheral neuropathy   GERD without esophagitis   Left rotator cuff tear   Bipolar affective disorder (HCC)   Musculoskeletal arm pain, left   Vaping nicotine dependence, non-tobacco product   Depression   Assessment and Plan:  * Syncope Etiology workup in progress, differentials include stroke, vasovagal, cardiac etiology, infectious etiology, polypharmacy and with accidentally taking sildenafil in the AM We will continue to follow second high since troponin Complete echo ordered on admission UA ordered by EDP pending collection at the time of this dictation Fall precaution PT, OT for evaluation on 07/11/2023  Depression Home Celexa 10 mg daily, duloxetine 120 mg daily were resumed on admission  Vaping nicotine dependence, non-tobacco product Extensive counseling given for cessation including risk of continued vaping and benefit of cessation Call when 1 800-[email or web address]  Musculoskeletal arm pain, left And left chest pain Worse with movement and laughter Suspect secondary to falling this AM Acetaminophen 650 mg p.o./rectal every 6 hours as needed for mild pain and fever; Toradol 50 mg IV every 6 hours as needed for moderate pain Lidocaine 1-2 patch applied to the left chest, 5 days ordered Morphine 4 mg IV every 4 hours as needed for severe pain, 20 hours of coverage ordered  Bipolar affective disorder [facility]) Patient takes Lamictal 125 mg daily, risperidone 0.5 mg nightly, Latuda 40 mg nightly Home carbamazepine 100 mg p.o. twice daily were resumed on admission These medications were resumed on admission  Left rotator cuff tear Status post left shoulder arthroscopic rotator cuff repair, subacromial decompression in 09/18/2022  GERD without esophagitis Home PPI dosing equivalent resumed on admission  Erectile  dysfunction Home sildenafil not resumed on admission  Syncope and collapse Home Zanaflex not resumed on admission  Polypharmacy-Zanaflex not resumed on admission - The next consideration would be to decrease gabapentin as patient is currently on 1200 mg nightly Chart reviewed.   DVT prophylaxis: heparin 5000 units q8h Code Status: full code Diet: Heart healthy diet Family Communication: no Disposition Plan: Pending clinical course Consults called: none at this time Admission status: Telemetry medical, observation  Past Medical History:  Diagnosis Date   Arrhythmia    Arthritis    Back pain    Bipolar affect, depressed (HCC)    GERD (gastroesophageal reflux disease)    Hearing loss    Neuropathy    Sleep apnea    Tremor    Past Surgical History:  Procedure Laterality Date   BACK SURGERY  2000,2010   BACK SURGERY     CERVICAL SPINE SURGERY     COLONOSCOPY WITH PROPOFOL N/A 09/24/2017   Procedure: COLONOSCOPY WITH PROPOFOL;  Surgeon: Christena Deem, MD;  Location: [facility] ENDOSCOPY;  Service: Endoscopy;  Laterality: N/A;   KNEE SURGERY Left 2003   TONSILLECTOMY     WISDOM TOOTH EXTRACTION     Social History:  reports that he quit smoking about 21 years ago. His smoking use included cigarettes. He has quit using smokeless tobacco.  His smokeless tobacco use included chew. He reports current alcohol  use of about 3.0 standard drinks of alcohol per week. He reports that he does not use drugs.  Allergies  Allergen Reactions   Latex Shortness Of Breath    Peels skin off (Bandage after back surgery).  No issues with gloves, balloons or underwear elastic   Penicillins Shortness Of Breath   Fentanyl Itching and Other (See Comments)    Hallucinations   Codeine Itching   Ibuprofen Nausea Only   Sulfa Antibiotics Itching   Family History  Problem Relation Age of Onset   Breast cancer Daughter    Heart disease Mother        CABG    Hypertension Mother    Hyperlipidemia  Mother    Diabetes gravidarum Mother    Heart disease Father    Heart attack Father    Hyperlipidemia Father    Hypertension Father    Heart attack Sister    Heart disease Sister    Hyperlipidemia Sister    Hypertension Sister    Family history: Family history reviewed and not pertinent.  Prior to Admission medications   Medication Sig Start Date End Date Taking? Authorizing Provider  acetaminophen (TYLENOL) 500 MG tablet Take 2 tablets (1,000 mg total) by mouth every 8 (eight) hours. 09/18/22 09/18/23  Signa Kell, MD  carbamazepine (CARBATROL) 100 MG 12 hr capsule Take 100 mg by mouth 2 (two) times daily.    [provider]  Cholecalciferol (VITAMIN D-3) 125 MCG (5000 UT) TABS Take by mouth daily.    [provider]  citalopram (CELEXA) 20 MG tablet Take 10 mg by mouth daily.    [provider]  DULoxetine (CYMBALTA) 60 MG capsule Take 120 mg by mouth daily.  Patient not taking: Reported on 09/16/2022    [provider]  fluticasone (FLONASE) 50 MCG/ACT nasal spray Place 1 spray into both nostrils daily. 05/05/21 05/05/22  Elson Areas, PA-C  gabapentin (NEURONTIN) 800 MG tablet Take 1,200 mg by mouth.    [provider]  lamoTRIgine (LAMICTAL) 100 MG tablet Take 100 mg by mouth daily. Take with 25 mg for total of 125 mg    [provider]  lamoTRIgine (LAMICTAL) 25 MG tablet Take 25 mg by mouth daily. Take with 100 mg for total of 125 mg    [provider]  Melatonin 10 MG TABS Take by mouth at bedtime.    [provider]  omeprazole (PRILOSEC) 40 MG capsule Take 40 mg by mouth daily.    [provider]  ondansetron (ZOFRAN-ODT) 4 MG disintegrating tablet Take 1 tablet (4 mg total) by mouth every 8 (eight) hours as needed for nausea or vomiting. 09/18/22   Signa Kell, MD  oxyCODONE (ROXICODONE) 5 MG immediate release tablet Take 1-2 tablets (5-10 mg total) by mouth every 4 (four) hours as needed (pain).  09/18/22 09/18/23  Signa Kell, MD  risperiDONE (RISPERDAL) 0.5 MG tablet Take 0.5 mg by mouth at bedtime.    [provider]  sildenafil (VIAGRA) 50 MG tablet Take 50 mg by mouth daily as needed for erectile dysfunction.    [provider]  tiZANidine (ZANAFLEX) 4 MG tablet Take 4 mg by mouth in the morning and at bedtime.    [provider]  vitamin B-12 (CYANOCOBALAMIN) 1000 MCG tablet Take 1,000 mcg by mouth daily.    [provider]   Physical Exam: Vitals:   07/10/23 1214 07/10/23 1214 07/10/23 1612 07/10/23 1620  BP: 133/80   126/76  Pulse:  92   90  Resp: 14   16  Temp:   98.2 F (36.8 C)   TempSrc:   Oral   SpO2: 100% 99%  95%  Weight:      Height:       Constitutional: appears age appropriate, NAD, calm Eyes: PERRL, lids and conjunctivae normal ENMT: Mucous membranes are moist. Posterior pharynx clear of any exudate or lesions. Age-appropriate dentition. Hearing appropriate Neck: normal, supple, no masses, no thyromegaly Respiratory: clear to auscultation bilaterally, no wheezing, no crackles. Normal respiratory effort. No accessory muscle use.  Cardiovascular: Regular rate and rhythm, no murmurs / rubs / gallops. No extremity edema. 2+ pedal pulses. No carotid bruits.  Abdomen: no tenderness, no masses palpated, no hepatosplenomegaly. Bowel sounds positive.  Musculoskeletal: no clubbing / cyanosis. No joint deformity upper and lower extremities. Good ROM, no contractures, no atrophy. Normal muscle tone.  Skin: no rashes, lesions, ulcers. No induration Neurologic: Sensation intact. Strength 5/5 in all 4.  Psychiatric: Normal judgment and insight. Alert and oriented x 3. Normal mood.   EKG: independently reviewed, showing sinus tachycardia with rate of 102, QTc 497  x-rays in ED: I personally reviewed and I agree with radiologist reading as below.  CT Cervical Spine Wo Contrast Result Date: 07/10/2023 CLINICAL DATA:  Neck trauma EXAM: CT  CERVICAL SPINE WITHOUT CONTRAST TECHNIQUE: Multidetector CT imaging of the cervical spine was performed without intravenous contrast. Multiplanar CT image reconstructions were also generated. RADIATION DOSE REDUCTION: This exam was performed according to the departmental dose-optimization program which includes automated exposure control, adjustment of the mA and/or kV according to patient size and/or use of iterative reconstruction technique. COMPARISON:  CT cervical spine 07/21/2016 FINDINGS: Alignment: Cervical lordosis is maintained. Approximately 3 mm anterolisthesis of C3 on C4, previously approximately 1.5 mm possibly related to increased facet degeneration at this level. No facet subluxation or dislocation. Skull base and vertebrae: No compression fracture or displaced fracture in the cervical spine. 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 No suspicious osseous lesions. Anterior cervical fusion hardware from C5-C7. Interbody spacer devices from C4-5 through C6-7. Soft tissues and spinal canal: No prevertebral fluid or swelling. No visible canal hematoma. Disc levels: Mild intervertebral disc space narrowing at multiple levels. Disc osteophyte complexes at multiple levels with mild spinal canal stenosis at C3-4 and C5-6. Facet arthrosis throughout the cervical spine most pronounced on the left at C3-4. Upper chest: Negative. Other: None IMPRESSION: No acute fracture of the cervical spine. 3 mm anterolisthesis of C3 on C4 is increased since the prior CT, likely related to increased facet degeneration at this level. Postsurgical changes and degenerative changes as above. Electronically Signed   By: Emily Filbert M.D.   On: 07/10/2023 14:38   CT HEAD WO CONTRAST ( ) Result Date: 07/10/2023 CLINICAL DATA:  Head trauma EXAM: CT HEAD WITHOUT CONTRAST TECHNIQUE: Contiguous axial images were obtained from the base of the skull through the vertex without intravenous contrast. RADIATION DOSE REDUCTION:  This exam was performed according to the departmental dose-optimization program which includes automated exposure control, adjustment of the mA and/or kV according to patient size and/or use of iterative reconstruction technique. COMPARISON:  CT head 07/17/2018 FINDINGS: Brain: The far lateral aspect of the right frontal and temporal lobes is not visualized on axial images. No acute intracranial hemorrhage or CT evidence of acute infarct in the visualized portions of the parenchyma. Nonspecific hypoattenuation in the periventricular and subcortical white matter favored to reflect chronic microvascular ischemic changes.  No edema, mass effect, or midline shift. The basilar cisterns are patent. Ventricles: The ventricles are normal. Vascular: No hyperdense vessel or unexpected calcification. Skull: No acute or aggressive finding. Orbits: Orbits are symmetric. Sinuses: The visualized paranasal sinuses are clear. Other: Mastoid air cells are clear. IMPRESSION: No CT evidence of acute intracranial abnormality. Electronically Signed   By: Emily Filbert M.D.   On: 07/10/2023 14:31   DG Ribs Unilateral W/Chest Left Result Date: 07/10/2023 CLINICAL DATA:  Status post syncope and fall with left rib pain EXAM: LEFT RIBS AND CHEST - 4 VIEW COMPARISON:  Chest radiograph dated 12/15/2020 FINDINGS: No acute displaced fracture or other bone lesions are seen involving the ribs. Cervical spinal fixation hardware appears intact. There is no evidence of pneumothorax or pleural effusion. Patchy left retrocardiac opacity. Heart size and mediastinal contours are within normal limits. IMPRESSION: 1. No acute displaced rib fracture. 2. Patchy left retrocardiac opacity, likely atelectasis. Electronically Signed   By: Agustin Cree M.D.   On: 07/10/2023 13:30   Labs on Admission: I have personally reviewed following labs  CBC: Recent Labs  Lab 07/10/23 1221  WBC 8.4  HGB 14.9  HCT 41.5  MCV 89.4  PLT 333   Basic Metabolic  Panel: Recent Labs  Lab 07/10/23 1221  NA 132*  K 4.0  CL 103  CO2 22  GLUCOSE 108*  BUN 25*  CREATININE 1.15  CALCIUM 9.4   GFR: Estimated Creatinine Clearance: 68 mL/min (by C-G formula based on SCr of 1.15 mg/dL).  Liver Function Tests: Recent Labs  Lab 07/10/23 1221  AST 32  ALT 40  ALKPHOS 90  BILITOT 0.8  PROT 8.1  ALBUMIN 4.7   Recent Labs  Lab 07/10/23 1221  LIPASE 44   Cardiac Enzymes: Recent Labs  Lab 07/10/23 1221  CKTOTAL 366   CBG: Recent Labs  Lab 07/10/23 1205  GLUCAP 95   Urine analysis:    Component Value Date/Time   COLORURINE YELLOW (A) 07/10/2023 1221   APPEARANCEUR CLEAR (A) 07/10/2023 1221   LABSPEC 1.024 07/10/2023 1221   PHURINE 5.0 07/10/2023 1221   GLUCOSEU NEGATIVE 07/10/2023 1221   HGBUR NEGATIVE 07/10/2023 1221   BILIRUBINUR NEGATIVE 07/10/2023 1221   KETONESUR NEGATIVE 07/10/2023 1221   PROTEINUR NEGATIVE 07/10/2023 1221   NITRITE NEGATIVE 07/10/2023 1221   LEUKOCYTESUR NEGATIVE 07/10/2023 1221   This document was prepared using Dragon Voice Recognition software and may include unintentional dictation errors.  Dr. Sedalia Muta [facility]  If 7PM-7AM, please contact overnight-coverage provider If 7AM-7PM, please contact day attending provider [email or web address]  07/10/2023, 4:31 PM

## 2023-07-10 NOTE — Assessment & Plan Note (Signed)
 Home PPI dosing equivalent resumed on admission

## 2023-07-10 NOTE — Assessment & Plan Note (Signed)
 Home Celexa 10 mg daily, duloxetine 120 mg daily were resumed on admission

## 2023-07-10 NOTE — Assessment & Plan Note (Signed)
 Status post left shoulder arthroscopic rotator cuff repair, subacromial decompression in 09/18/2022

## 2023-07-10 NOTE — ED Notes (Signed)
 Pt to XR

## 2023-07-10 NOTE — Assessment & Plan Note (Signed)
 Extensive counseling given for cessation including risk of continued vaping and benefit of cessation Call when 1 [phone]

## 2023-07-10 NOTE — Assessment & Plan Note (Addendum)
 Patient takes Lamictal 125 mg daily, risperidone 0.5 mg nightly, Latuda 40 mg nightly Home carbamazepine 100 mg p.o. twice daily were resumed on admission These medications were resumed on admission

## 2023-07-10 NOTE — Hospital Course (Addendum)
 [name] is a 46 male with history of ED on sildenafil, GERD, inflammatory neuropathy, idiopathic peripheral neuropathy, depression, anxiety, LBBB, bipolar affective disorder, glucose intolerance, who presents to the ED for chief concern of syncope.  Vitals in the ED showed temperature of 97.4, respiration rate 20, heart rate 105, blood pressure 131/91, SpO2 100% on room air.  Serum sodium is 132, potassium 4.0, chloride 103, bicarb 22, BUN of 25, serum creatinine 1.15, EGFR greater than 60, nonfasting blood glucose 108, WBC 8.4, hemoglobin 14.9, platelets of 333.  High-sensitivity troponin is 5.  CK is 366.  CT cervical spine wo cm: no acute fracture of cervical spine.  CT head wo CM: no CT evidence of acute intracranial abnormality.  Left ribs with cxr: Read as no acute displaced rib fracture.  Patchy left retrocardiac opacity, likely atelectasis.  ED treatment: Dilaudid 0.5 mg IV one-time dose, ondansetron 4 mg IV one-time dose.  Hospitalist admission requested for syncope.

## 2023-07-11 ENCOUNTER — Observation Stay
Admit: 2023-07-11 | Discharge: 2023-07-11 | Disposition: A | Discharge status: Home / Self Care | Attending: Internal Medicine

## 2023-07-11 DIAGNOSIS — R55 Syncope and collapse: Secondary | ICD-10-CM | POA: Diagnosis not present

## 2023-07-11 LAB — CBC
HCT: 38.8 % — ABNORMAL LOW (ref 39.0–52.0)
Hemoglobin: 13.5 g/dL (ref 13.0–17.0)
MCH: 32.5 pg (ref 26.0–34.0)
MCHC: 34.8 g/dL (ref 30.0–36.0)
MCV: 93.3 fL (ref 80.0–100.0)
Platelets: 254 10*3/uL (ref 150–400)
RBC: 4.16 MIL/uL — ABNORMAL LOW (ref 4.22–5.81)
RDW: 12.5 % (ref 11.5–15.5)
WBC: 6.9 10*3/uL (ref 4.0–10.5)
nRBC: 0 % (ref 0.0–0.2)

## 2023-07-11 LAB — ECHOCARDIOGRAM COMPLETE
AR max vel: 2.68 cm2
AV Peak grad: 7.4 mmHg
Ao pk vel: 1.36 m/s
Area-P 1/2: 3.08 cm2
Height: 67 in
S' Lateral: 2.2 cm
Weight: 2962.98 [oz_av]

## 2023-07-11 LAB — BASIC METABOLIC PANEL
Anion gap: 7 (ref 5–15)
BUN: 20 mg/dL (ref 8–23)
CO2: 25 mmol/L (ref 22–32)
Calcium: 8.9 mg/dL (ref 8.9–10.3)
Chloride: 102 mmol/L (ref 98–111)
Creatinine, Ser: 1.07 mg/dL (ref 0.61–1.24)
GFR, Estimated: >60 mL/min (ref >60)
Glucose, Bld: 106 mg/dL — ABNORMAL HIGH (ref 70–99)
Potassium: 3.7 mmol/L (ref 3.5–5.1)
Sodium: 134 mmol/L — ABNORMAL LOW (ref 135–145)

## 2023-07-11 LAB — GLUCOSE, CAPILLARY: Glucose-Capillary: 122 mg/dL — ABNORMAL HIGH (ref 70–99)

## 2023-07-11 MED ORDER — METHOCARBAMOL 500 MG PO TABS: 500.0000 mg | ORAL_TABLET | Freq: Three times a day (TID) | ORAL | Status: DC

## 2023-07-11 MED ORDER — METHOCARBAMOL 500 MG PO TABS: 500.0000 mg | ORAL_TABLET | Freq: Three times a day (TID) | ORAL | 0 refills | Status: AC | PRN

## 2023-07-11 MED ORDER — OXYCODONE HCL 5 MG PO TABS: 5.0000 mg | ORAL_TABLET | Freq: Four times a day (QID) | ORAL | 0 refills | Status: AC | PRN

## 2023-07-11 MED ORDER — OXYCODONE HCL 5 MG PO TABS
5.0000 mg | ORAL_TABLET | ORAL | Status: DC | PRN
Start: 2023-07-11 — End: 2023-07-11

## 2023-07-11 NOTE — Discharge Summary (Signed)
 Physician Discharge Summary  Jake Richards YNW:[id number] DOB: 24-Mar-1956 DOA: 07/10/2023  PCP: Dione Housekeeper, MD  Admit date: 07/10/2023 Discharge date: 07/11/2023  Admitted From: Home Disposition:  Home  Recommendations for Outpatient Follow-up:  Follow up with PCP in 1-2 weeks   Home Health:No (offered Pt and OT, patient declined) Equipment/Devices:None   Discharge Condition:Stable  CODE STATUS:FULL  Diet recommendation: Reg  Brief/Interim Summary: 68 male with history of ED on sildenafil, GERD, inflammatory neuropathy, idiopathic peripheral neuropathy, depression, anxiety, LBBB, bipolar affective disorder, glucose intolerance, who presents to the ED for chief concern of syncope.   He reports that at approximately 10 am today, he was sitting watching TV, when he felt the need to urinate. He got up, walked to the restroom to switch on the light and the next thing he knew, he was laying between the wall and the toilet. He fell so hard that he caused the toilet to displaced from position.   He woke up about 2-3 times laying on the floor however, felt so weak that he was not able to get himself up. He did not urinate on himself during the episode. He endorsed some nausea and denies vomiting.   Ultimately, he was able to get himself up and drove himself to the ED.   Prior to passing out, he denies chest pain, dyspnea, abdominal pain, diarrhea, fever, chills, nausea, vomiting.    He reports recent stressors recently, as his wife of 5 years said she wanted a divorce last month on their 5 year anniversary.    He took a sildenafil last evening (3/21) around 9/10 pm as he was anticipating sexual activity with his estranged wife, however they did not have intercourse as he did not go back to their marital home.   He states he took his AM medications this morning around 9a/9:30a prior to passing out (~ 10am). He states all his medications are in a bag together. He states he did  not think he took a sildenafil this morning, however is not sure as all his medications are stored in one bag.   He reports a new addition to his bipolar medication that was started recently and he does not know the name of the medication. He states it was an evening medication that was added.  3/23: Overall workup reassuring.  Seen by PT and OT.  Ambulated well with no syncopal or presyncopal events.  Orthostatics negative.  Echocardiogram reviewed and normal.  Normal ejection fraction with grade 1 diastolic dysfunction.  I suspect vasovagal reaction in the setting of intravascular volume depletion/dehydration, recent life stressors, recent increase in his bipolar medications.  Patient states his hydration mainly comes from coffee and tea.  Explained that these are natural diuretics and are likely making him more dehydrated.  Encouraged need to consume water and juice or other nondiuretic beverages.  Patient's main complaint at time of discharge was left rib pain.  I suspect bruised ribs/contusion.  Rib x-ray negative for fracture or dislocation.  Multimodal pain regimen.  Patient stable for discharge home at this time.    Discharge Diagnoses:  Principal Problem:   Syncope Active Problems:   Syncope and collapse   HLD (hyperlipidemia)   Erectile dysfunction   Idiopathic peripheral neuropathy   GERD without esophagitis   Left rotator cuff tear   Bipolar affective disorder (HCC)   Musculoskeletal arm pain, left   Vaping nicotine dependence, non-tobacco product   Depression  * Syncope Suspect vasovagal syncope.  Do not  suspect cardiac etiology.  No evidence of infection or CVA.  Suspect vasovagal response to dehydration and recent increase in bipolar medications as well as recent life stressors.  Patient stable at time of discharge.  Recommend adequate hydration.    Musculoskeletal arm pain, left Suspect rib contusion secondary to trauma.  No fracture noted on dedicated rib x-ray.  Pain  improved but not yet at baseline at time of discharge.  Recommend multimodal pain regimen.  Prescribed on DC.   Discharge Instructions  Discharge Instructions     Diet - low sodium heart healthy   Complete by: As directed    Increase activity slowly   Complete by: As directed       Allergies as of 07/11/2023       Reactions   Latex Shortness Of Breath   Peels skin off (Bandage after back surgery).  No issues with gloves, balloons or underwear elastic   Penicillins Shortness Of Breath   Fentanyl Itching, Other (See Comments)   Hallucinations   Codeine Itching   Ibuprofen Nausea Only   Sulfa Antibiotics Itching        Medication List     TAKE these medications    acetaminophen 500 MG tablet Commonly known as: TYLENOL Take 2 tablets (1,000 mg total) by mouth every 8 (eight) hours.   carbamazepine 100 MG 12 hr tablet Commonly known as: TEGRETOL XR Take 100 mg by mouth 2 (two) times daily.   citalopram 10 MG tablet Commonly known as: CELEXA Take 10 mg by mouth daily.   cyanocobalamin 1000 MCG tablet Commonly known as: VITAMIN B12 Take 1,000 mcg by mouth daily.   DULoxetine 60 MG capsule Commonly known as: CYMBALTA Take 120 mg by mouth daily.   gabapentin 800 MG tablet Commonly known as: NEURONTIN Take 1,200 mg by mouth.   lamoTRIgine 25 MG tablet Commonly known as: LAMICTAL Take 25 mg by mouth daily. Take with 100 mg for total of 125 mg   LaMICtal 100 MG tablet Generic drug: lamoTRIgine Take 100 mg by mouth daily. Take with 25 mg for total of 125 mg   lurasidone 40 MG Tabs tablet Commonly known as: LATUDA Take 40 mg by mouth daily.   Melatonin 10 MG Tabs Take 10 mg by mouth at bedtime.   methocarbamol 500 MG tablet Commonly known as: ROBAXIN Take 1 tablet (500 mg total) by mouth every 8 (eight) hours as needed for up to 10 days for muscle spasms.   omeprazole 40 MG capsule Commonly known as: PRILOSEC Take 40 mg by mouth daily.   oxyCODONE 5  MG immediate release tablet Commonly known as: Oxy IR/ROXICODONE Take 1 tablet (5 mg total) by mouth every 6 (six) hours as needed for severe pain (pain score 7-10).   risperiDONE 0.5 MG tablet Commonly known as: RISPERDAL Take 0.5 mg by mouth at bedtime.   sildenafil 50 MG tablet Commonly known as: VIAGRA Take 50 mg by mouth daily as needed for erectile dysfunction.   tiZANidine 4 MG tablet Commonly known as: ZANAFLEX Take 4 mg by mouth 2 (two) times daily as needed for muscle spasms.   Vitamin D-3 125 MCG (5000 UT) Tabs Take 5,000 Units by mouth daily.        Follow-up Information     Olmedo, Joycie Peek, MD Follow up.   Specialty: Family Medicine Why: Hospital follow up Contact information: [address] [place] [address] [phone]  Allergies  Allergen Reactions   Latex Shortness Of Breath    Peels skin off (Bandage after back surgery).  No issues with gloves, balloons or underwear elastic   Penicillins Shortness Of Breath   Fentanyl Itching and Other (See Comments)    Hallucinations   Codeine Itching   Ibuprofen Nausea Only   Sulfa Antibiotics Itching    Consultations: None   Procedures/Studies: ECHOCARDIOGRAM COMPLETE Result Date: 07/11/2023    ECHOCARDIOGRAM REPORT   Patient Name:   TAHEEM FRICKE Date of Exam: 07/11/2023 Medical Rec #:  [id number]           Height:       67.0 in Accession #:    [id number]          Weight:       185.2 lb Date of Birth:  08/11/1955           BSA:          1.957 m Patient Age:    67 years            BP:           98/69 mmHg Patient Gender: M                   HR:           78 bpm. Exam Location:  [facility] Procedure: 2D Echo, Cardiac Doppler and Color Doppler (Both Spectral and Color            Flow Doppler were utilized during procedure). Indications:     Syncope R55  History:         Patient has prior history of Echocardiogram examinations.  Sonographer:     Elwin Sleight RDCS Referring Phys:   [id number] AMY N COX Diagnosing Phys: Adrian Blackwater  Sonographer Comments: Suboptimal subcostal window. Image acquisition challenging due to respiratory motion. IMPRESSIONS  1. Left ventricular ejection fraction, by estimation, is 60 to 65%. The left ventricle has normal function. The left ventricle has no regional wall motion abnormalities. Left ventricular diastolic parameters are consistent with Grade I diastolic dysfunction (impaired relaxation).  2. Right ventricular systolic function is normal. The right ventricular size is normal.  3. Left atrial size was mildly dilated.  4. The mitral valve is normal in structure. Trivial mitral valve regurgitation. No evidence of mitral stenosis.  5. The aortic valve is normal in structure. Aortic valve regurgitation is not visualized. No aortic stenosis is present.  6. The inferior vena cava is normal in size with greater than 50% respiratory variability, suggesting right atrial pressure of 3 mmHg. FINDINGS  Left Ventricle: Left ventricular ejection fraction, by estimation, is 60 to 65%. The left ventricle has normal function. The left ventricle has no regional wall motion abnormalities. Strain was performed and the global longitudinal strain is indeterminate. The left ventricular internal cavity size was normal in size. There is no left ventricular hypertrophy. Left ventricular diastolic parameters are consistent with Grade I diastolic dysfunction (impaired relaxation). Right Ventricle: The right ventricular size is normal. No increase in right ventricular wall thickness. Right ventricular systolic function is normal. Left Atrium: Left atrial size was mildly dilated. Right Atrium: Right atrial size was normal in size. Pericardium: There is no evidence of pericardial effusion. Mitral Valve: The mitral valve is normal in structure. Trivial mitral valve regurgitation. No evidence of mitral valve stenosis. Tricuspid Valve: The tricuspid valve is normal in structure. Tricuspid  valve regurgitation is mild . No evidence of tricuspid  stenosis. Aortic Valve: The aortic valve is normal in structure. Aortic valve regurgitation is not visualized. No aortic stenosis is present. Aortic valve peak gradient measures 7.4 mmHg. Pulmonic Valve: The pulmonic valve was normal in structure. Pulmonic valve regurgitation is trivial. No evidence of pulmonic stenosis. Aorta: The aortic root is normal in size and structure. Venous: The inferior vena cava is normal in size with greater than 50% respiratory variability, suggesting right atrial pressure of 3 mmHg. IAS/Shunts: No atrial level shunt detected by color flow Doppler. Additional Comments: 3D was performed not requiring image post processing on an independent workstation and was indeterminate.  LEFT VENTRICLE PLAX 2D LVIDd:         3.10 cm   Diastology LVIDs:         2.20 cm   LV e' medial:    7.62 cm/s LV PW:         1.10 cm   LV E/e' medial:  6.7 LV IVS:        1.20 cm   LV e' lateral:   11.40 cm/s LVOT diam:     2.20 cm   LV E/e' lateral: 4.5 LV SV:         71 LV SV Index:   37 LVOT Area:     3.80 cm  RIGHT VENTRICLE RV Basal diam:  3.80 cm RV S prime:     11.60 cm/s TAPSE (M-mode): 2.2 cm LEFT ATRIUM             Index        RIGHT ATRIUM           Index LA diam:        3.40 cm 1.74 cm/m   RA Area:     12.40 cm LA Vol (A2C):   31.3 ml 15.99 ml/m  RA Volume:   26.50 ml  13.54 ml/m LA Vol (A4C):   30.0 ml 15.33 ml/m LA Biplane Vol: 30.9 ml 15.79 ml/m  AORTIC VALVE                 PULMONIC VALVE AV Area (Vmax): 2.68 cm     PV Vmax:        1.03 m/s AV Vmax:        136.00 cm/s  PV Peak grad:   4.3 mmHg AV Peak Grad:   7.4 mmHg     RVOT Peak grad: 3 mmHg LVOT Vmax:      96.00 cm/s LVOT Vmean:     64.400 cm/s LVOT VTI:       0.188 m  AORTA Ao Root diam: 3.50 cm Ao Asc diam:  3.70 cm MITRAL VALVE               TRICUSPID VALVE MV Area (PHT): 3.08 cm    TR Peak grad:   18.8 mmHg MV Decel Time: 246 msec    TR Vmax:        217.00 cm/s MV E velocity:  51.20 cm/s MV A velocity: 75.20 cm/s  SHUNTS MV E/A ratio:  0.68        Systemic VTI:  0.19 m                            Systemic Diam: 2.20 cm Adrian Blackwater Electronically signed by Adrian Blackwater Signature Date/Time: 07/11/2023/12:28:13 PM    Final    CT Cervical Spine Wo Contrast Result Date: 07/10/2023 CLINICAL DATA:  Neck trauma EXAM:  CT CERVICAL SPINE WITHOUT CONTRAST TECHNIQUE: Multidetector CT imaging of the cervical spine was performed without intravenous contrast. Multiplanar CT image reconstructions were also generated. RADIATION DOSE REDUCTION: This exam was performed according to the departmental dose-optimization program which includes automated exposure control, adjustment of the mA and/or kV according to patient size and/or use of iterative reconstruction technique. COMPARISON:  CT cervical spine 07/21/2016 FINDINGS: Alignment: Cervical lordosis is maintained. Approximately 3 mm anterolisthesis of C3 on C4, previously approximately 1.5 mm possibly related to increased facet degeneration at this level. No facet subluxation or dislocation. Skull base and vertebrae: No compression fracture or displaced fracture in the cervical spine. 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 No suspicious osseous lesions. Anterior cervical fusion hardware from C5-C7. Interbody spacer devices from C4-5 through C6-7. Soft tissues and spinal canal: No prevertebral fluid or swelling. No visible canal hematoma. Disc levels: Mild intervertebral disc space narrowing at multiple levels. Disc osteophyte complexes at multiple levels with mild spinal canal stenosis at C3-4 and C5-6. Facet arthrosis throughout the cervical spine most pronounced on the left at C3-4. Upper chest: Negative. Other: None IMPRESSION: No acute fracture of the cervical spine. 3 mm anterolisthesis of C3 on C4 is increased since the prior CT, likely related to increased facet degeneration at this level. Postsurgical changes and degenerative changes as  above. Electronically Signed   By: Emily Filbert M.D.   On: 07/10/2023 14:38   CT HEAD WO CONTRAST ( ) Result Date: 07/10/2023 CLINICAL DATA:  Head trauma EXAM: CT HEAD WITHOUT CONTRAST TECHNIQUE: Contiguous axial images were obtained from the base of the skull through the vertex without intravenous contrast. RADIATION DOSE REDUCTION: This exam was performed according to the departmental dose-optimization program which includes automated exposure control, adjustment of the mA and/or kV according to patient size and/or use of iterative reconstruction technique. COMPARISON:  CT head 07/17/2018 FINDINGS: Brain: The far lateral aspect of the right frontal and temporal lobes is not visualized on axial images. No acute intracranial hemorrhage or CT evidence of acute infarct in the visualized portions of the parenchyma. Nonspecific hypoattenuation in the periventricular and subcortical white matter favored to reflect chronic microvascular ischemic changes. No edema, mass effect, or midline shift. The basilar cisterns are patent. Ventricles: The ventricles are normal. Vascular: No hyperdense vessel or unexpected calcification. Skull: No acute or aggressive finding. Orbits: Orbits are symmetric. Sinuses: The visualized paranasal sinuses are clear. Other: Mastoid air cells are clear. IMPRESSION: No CT evidence of acute intracranial abnormality. Electronically Signed   By: Emily Filbert M.D.   On: 07/10/2023 14:31   DG Ribs Unilateral W/Chest Left Result Date: 07/10/2023 CLINICAL DATA:  Status post syncope and fall with left rib pain EXAM: LEFT RIBS AND CHEST - 4 VIEW COMPARISON:  Chest radiograph dated 12/15/2020 FINDINGS: No acute displaced fracture or other bone lesions are seen involving the ribs. Cervical spinal fixation hardware appears intact. There is no evidence of pneumothorax or pleural effusion. Patchy left retrocardiac opacity. Heart size and mediastinal contours are within normal limits. IMPRESSION: 1. No  acute displaced rib fracture. 2. Patchy left retrocardiac opacity, likely atelectasis. Electronically Signed   By: Agustin Cree M.D.   On: 07/10/2023 13:30      Subjective: Seen and examined on the day of discharge.  Stable.  Mild distress secondary to left rib pain.  Appropriate for discharge home.  Discharge Exam: Vitals:   07/11/23 0903 07/11/23 1543  BP: 129/75 106/73  Pulse: 83 89  Resp: 16  18  Temp: 97.9 F (36.6 C) 98.1 F (36.7 C)  SpO2: 95% 90%   Vitals:   07/11/23 0500 07/11/23 0512 07/11/23 0903 07/11/23 1543  BP:  98/69 129/75 106/73  Pulse:  74 83 89  Resp:  14 16 18   Temp:  97.7 F (36.5 C) 97.9 F (36.6 C) 98.1 F (36.7 C)  TempSrc:   Oral Oral  SpO2:  97% 95% 90%  Weight: 84 kg     Height:        General: Pt is alert, awake, not in acute distress Cardiovascular: RRR, S1/S2 +, no rubs, no gallops Respiratory: CTA bilaterally, no wheezing, no rhonchi Abdominal: Soft, NT, ND, bowel sounds + Extremities: no edema, no cyanosis    The results of significant diagnostics from this hospitalization (including imaging, microbiology, ancillary and laboratory) are listed below for reference.     Microbiology: No results found for this or any previous visit (from the past 240 hours).   Labs: BNP (last 3 results) No results for input(s): "BNP" in the last 8760 hours. Basic Metabolic Panel: Recent Labs  Lab 07/10/23 1221 07/11/23 0444  NA 132* 134*  K 4.0 3.7  CL 103 102  CO2 22 25  GLUCOSE 108* 106*  BUN 25* 20  CREATININE 1.15 1.07  CALCIUM 9.4 8.9   Liver Function Tests: Recent Labs  Lab 07/10/23 1221  AST 32  ALT 40  ALKPHOS 90  BILITOT 0.8  PROT 8.1  ALBUMIN 4.7   Recent Labs  Lab 07/10/23 1221  LIPASE 44   No results for input(s): "AMMONIA" in the last 168 hours. CBC: Recent Labs  Lab 07/10/23 1221 07/11/23 0444  WBC 8.4 6.9  HGB 14.9 13.5  HCT 41.5 38.8*  MCV 89.4 93.3  PLT 333 254   Cardiac Enzymes: Recent Labs  Lab  07/10/23 1221  CKTOTAL 366   BNP: Invalid input(s): "POCBNP" CBG: Recent Labs  Lab 07/10/23 1205 07/11/23 0903  GLUCAP 95 122*   D-Dimer No results for input(s): "DDIMER" in the last 72 hours. Hgb A1c No results for input(s): "HGBA1C" in the last 72 hours. Lipid Profile No results for input(s): "CHOL", "HDL", "LDLCALC", "TRIG", "CHOLHDL", "LDLDIRECT" in the last 72 hours. Thyroid function studies No results for input(s): "TSH", "T4TOTAL", "T3FREE", "THYROIDAB" in the last 72 hours.  Invalid input(s): "FREET3" Anemia work up No results for input(s): "VITAMINB12", "FOLATE", "FERRITIN", "TIBC", "IRON", "RETICCTPCT" in the last 72 hours. Urinalysis    Component Value Date/Time   COLORURINE YELLOW (A) 07/10/2023 1221   APPEARANCEUR CLEAR (A) 07/10/2023 1221   LABSPEC 1.024 07/10/2023 1221   PHURINE 5.0 07/10/2023 1221   GLUCOSEU NEGATIVE 07/10/2023 1221   HGBUR NEGATIVE 07/10/2023 1221   BILIRUBINUR NEGATIVE 07/10/2023 1221   KETONESUR NEGATIVE 07/10/2023 1221   PROTEINUR NEGATIVE 07/10/2023 1221   NITRITE NEGATIVE 07/10/2023 1221   LEUKOCYTESUR NEGATIVE 07/10/2023 1221   Sepsis Labs Recent Labs  Lab 07/10/23 1221 07/11/23 0444  WBC 8.4 6.9   Microbiology No results found for this or any previous visit (from the past 240 hours).   Time coordinating discharge: Over 30 minutes  SIGNED:   Tresa Moore, MD  [facility] 07/11/2023, 4:04 PM Pager   If 7PM-7AM, please contact night-coverage

## 2023-07-11 NOTE — Evaluation (Signed)
 Occupational Therapy Evaluation Patient Details Name: Jake Richards MRN: [id number] DOB: [birth year] Today's Date: 07/11/2023   History of Present Illness   Patient is a 68 year old male who presents to hospital with syncope s/p fall where he was laying between wall and toilet. Per patient he fell so hard he displaced the toilet. He woke up 2-3 times laying on the floor but could not get up initially. 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 Imaging negative for : CT cervical spine, Ct head, L ribs,     Clinical Impressions Chart reviwed, pt greeted in room, agreeable to OT evaluation. Pt is alert and oriented x4. PTA pt is indep in ADL/IADL. Pt presents with deficits in decreased activity tolerance and balance, citing L sided pain from fall. Pt reports he feels better, no dizziness with position changes. ADLs/functional mobility performed grossly at a supervision-CGA level including up/down approx 20 stairs. Please see further details below. Pt reports he has called his daughter and will stay with her for a few days post discharge. Pt is left in bed, all needs met. OT will follow acutely.     If plan is discharge home, recommend the following:   Assistance with cooking/housework;Help with stairs or ramp for entrance     Functional Status Assessment   Patient has had a recent decline in their functional status and demonstrates the ability to make significant improvements in function in a reasonable and predictable amount of time.     Equipment Recommendations   None recommended by OT     Recommendations for Other Services         Precautions/Restrictions   Precautions Precautions: Fall Recall of Precautions/Restrictions: Intact Restrictions Weight Bearing Restrictions Per Provider Order: No     Mobility Bed Mobility Overal bed  mobility: Modified Independent                  Transfers Overall transfer level: Needs assistance Equipment used: None Transfers: Sit to/from Stand Sit to Stand: Supervision                  Balance Overall balance assessment: Needs assistance Sitting-balance support: No upper extremity supported, Feet supported Sitting balance-Leahy Scale: Good     Standing balance support: Single extremity supported, During functional activity Standing balance-Leahy Scale: Fair                             ADL either performed or assessed with clinical judgement   ADL Overall ADL's : Needs assistance/impaired Eating/Feeding: Set up   Grooming: Oral care;Standing;Supervision/safety Grooming Details (indicate cue type and reason): sink level with no AD with close supervision             Lower Body Dressing: Contact guard assist Lower Body Dressing Details (indicate cue type and reason): simulated Toilet Transfer: Supervision/safety;Contact guard assist;Ambulation Toilet Transfer Details (indicate cue type and reason): simulated Toileting- Clothing Manipulation and Hygiene: Supervision/safety Toileting - Clothing Manipulation Details (indicate cue type and reason): anticipate     Functional mobility during ADLs: Supervision/safety;Contact guard assist (approx 200' with no AD, pt intermittently holding onto hallway rail for support) General ADL Comments: Pt performed stair training approx 20' with rail going up on R with CGA, intermittent vcs for techinque     Vision Patient Visual Report: No change from baseline       Perception  Praxis         Pertinent Vitals/Pain Pain Assessment Pain Assessment: Faces Faces Pain Scale: Hurts a little bit Pain Location: L sided rib pain Pain Descriptors / Indicators: Discomfort Pain Intervention(s): Monitored during session     Extremity/Trunk Assessment Upper Extremity Assessment Upper Extremity  Assessment: Overall WFL for tasks assessed   Lower Extremity Assessment Lower Extremity Assessment: Defer to PT evaluation   Cervical / Trunk Assessment Cervical / Trunk Assessment: Normal   Communication Communication Communication: No apparent difficulties   Cognition Arousal: Alert   Cognition: No apparent impairments                               Following commands: Intact       Cueing  General Comments   Cueing Techniques: Verbal cues  BP 126/78 after mobility   Exercises Other Exercises Other Exercises: edu re: role of OT, role of rehab, discharge recommendations, home safety, falls prevention, DME use for safe ADLs, energy conservation   Shoulder Instructions      Home Living Family/patient expects to be discharged to:: Private residence Living Arrangements: Alone Available Help at Discharge: Family;Available PRN/intermittently Type of Home: Apartment Home Access: Stairs to enter Entrance Stairs-Number of Steps: 19 or 25 (pt told OT 19, PT 25) Entrance Stairs-Rails: Right;Left Home Layout: One level     Bathroom Shower/Tub: Producer, television/film/video: Standard Bathroom Accessibility: No   Home Equipment: Cane - single point   Additional Comments: independent at baseline      Prior Functioning/Environment Prior Level of Function : Independent/Modified Independent;History of Falls (last six months)             Mobility Comments: amb with no AD ADLs Comments: indep in ADL/IADL,    OT Problem List: Decreased activity tolerance;Decreased knowledge of use of DME or AE   OT Treatment/Interventions: Self-care/ADL training;DME and/or AE instruction;Therapeutic activities;Balance training;Therapeutic exercise;Energy conservation;Patient/family education      OT Goals(Current goals can be found in the care plan section)   Acute Rehab OT Goals Patient Stated Goal: go home OT Goal Formulation: With patient Time For Goal  Achievement: 07/25/23 Potential to Achieve Goals: Good ADL Goals Pt Will Perform Grooming: Independently Pt Will Perform Lower Body Dressing: Independently;with modified independence Pt Will Transfer to Toilet: Independently;with modified independence Pt Will Perform Toileting - Clothing Manipulation and hygiene: Independently;with modified independence   OT Frequency:  Min 1X/week    Co-evaluation              AM-PAC OT "6 Clicks" Daily Activity     Outcome Measure Help from another person eating meals?: None Help from another person taking care of personal grooming?: None Help from another person toileting, which includes using toliet, bedpan, or urinal?: None Help from another person bathing (including washing, rinsing, drying)?: A Little Help from another person to put on and taking off regular upper body clothing?: None Help from another person to put on and taking off regular lower body clothing?: None 6 Click Score: 23   End of Session Equipment Utilized During Treatment: Gait belt Nurse Communication: Mobility status  Activity Tolerance: Patient tolerated treatment well Patient left: in bed;with call bell/phone within reach;with bed alarm set  OT Visit Diagnosis: Unsteadiness on feet (R26.81)                Time: 4696-2952 OT Time Calculation (min): 23 min Charges:  OT General Charges $  OT Visit: 1 Visit OT Evaluation $OT Eval Moderate Complexity: 1 Mod Oleta Mouse, OTD OTR/L  07/11/23, 3:50 PM

## 2023-07-11 NOTE — Evaluation (Signed)
 Physical Therapy Evaluation Patient Details Name: Jake Richards MRN: [id number] DOB: July 29, 1955 Today's Date: 07/11/2023  History of Present Illness  Patient is a 68 year old male who presents to hospital with syncope s/p fall where he was laying between wall and toilet. Per patient he fell so hard he displaced the toilet. He woke up 2-3 times laying on the floor but could not get up initially. 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 Imaging negative for : CT cervical spine, Ct head, L ribs,   Clinical Impression  Patient is a pleasant 68 year old male with diffuse weakness s/p fall. Prior to hospital admission, pt was independent and lives in an apartment alone.  Patient is retired and active at baseline with no prior histories of falls. Has an active social and family life. Patient in bed upon PT arrival and agreeable to participate with therapy. Patient ambulated approximately 80 ft prior to needing single hand hold assist due to fatigue. He then was able to complete the loop around the nursing station and return to his room. He sat on bed while chair was set up and then transitioned to chair with supervision. Needs met upon arriving at chair. No reports of dizziness throughout entire session. Pt would benefit from skilled PT to address noted impairments and functional limitations (see below for any additional details).           If plan is discharge home, recommend the following: A little help with walking and/or transfers;Assistance with cooking/housework;Help with stairs or ramp for entrance   Can travel by private vehicle        Equipment Recommendations None recommended by PT  Recommendations for Other Services       Functional Status Assessment Patient has had a recent decline in their functional status and demonstrates the ability to make  significant improvements in function in a reasonable and predictable amount of time.     Precautions / Restrictions Precautions Precautions: Fall Restrictions Weight Bearing Restrictions Per Provider Order: No      Mobility  Bed Mobility Overal bed mobility: Modified Independent             General bed mobility comments: increase time to get EOB    Transfers Overall transfer level: Modified independent Equipment used: None               General transfer comment: supervision    Ambulation/Gait Ambulation/Gait assistance: Contact guard assist Gait Distance (Feet): 180 Feet Assistive device: 1 person hand held assist Gait Pattern/deviations: Step-through pattern, Decreased stride length, Narrow base of support       General Gait Details: single hand hold assist  Stairs            Wheelchair Mobility     Tilt Bed    Modified Rankin (Stroke Patients Only)       Balance Overall balance assessment: Needs assistance Sitting-balance support: No upper extremity supported, Feet supported Sitting balance-Leahy Scale: Good Sitting balance - Comments: able to static sit without increase in dizziness   Standing balance support: Single extremity supported, During functional activity Standing balance-Leahy Scale: Fair Standing balance comment: able to ambualte without Single hand for 80 ft, then fatigues and requires single hand assist                             Pertinent Vitals/Pain Pain Assessment Pain  Assessment: No/denies pain    Home Living Family/patient expects to be discharged to:: Private residence Living Arrangements: Alone Available Help at Discharge: Family (daughter lives nearby and able to help per patient) Type of Home: Apartment (apartment above barn) Home Access: Stairs to enter Entrance Stairs-Rails: Doctor, general practice of Steps: 25   Home Layout: One level Home Equipment: None Additional Comments:  independent at baseline    Prior Function Prior Level of Function : Independent/Modified Independent             Mobility Comments: ambulates without AD, no deficits at baseline ADLs Comments: independent     Extremity/Trunk Assessment   Upper Extremity Assessment Upper Extremity Assessment:  (defer to OT)    Lower Extremity Assessment Lower Extremity Assessment: Overall WFL for tasks assessed (grossly 4/5 bilaterally)    Cervical / Trunk Assessment Cervical / Trunk Assessment: Normal  Communication   Communication Communication: No apparent difficulties    Cognition Arousal: Alert Behavior During Therapy: WFL for tasks assessed/performed   PT - Cognitive impairments: No apparent impairments                         Following commands: Intact       Cueing Cueing Techniques: Verbal cues, Gestural cues, Tactile cues, Visual cues     General Comments General comments (skin integrity, edema, etc.): patient appears well nourished and groomed. Some residual bruising from fall    Exercises Other Exercises Other Exercises: Patient educated on role of PT in acute care setting, safe mobility and transfers.   Assessment/Plan    PT Assessment Patient needs continued PT services  PT Problem List Decreased strength;Decreased activity tolerance;Decreased balance       PT Treatment Interventions DME instruction;Gait training;Stair training;Therapeutic exercise;Therapeutic activities;Functional mobility training;Balance training;Neuromuscular re-education;Cognitive remediation;Patient/family education    PT Goals (Current goals can be found in the Care Plan section)  Acute Rehab PT Goals Patient Stated Goal: to return home PT Goal Formulation: With patient Time For Goal Achievement: 07/25/23 Potential to Achieve Goals: Fair    Frequency Min 2X/week     Co-evaluation               AM-PAC PT "6 Clicks" Mobility  Outcome Measure Help needed turning  from your back to your side while in a flat bed without using bedrails?: None Help needed moving from lying on your back to sitting on the side of a flat bed without using bedrails?: A Little Help needed moving to and from a bed to a chair (including a wheelchair)?: A Little Help needed standing up from a chair using your arms (e.g., wheelchair or bedside chair)?: A Little Help needed to walk in hospital room?: A Little Help needed climbing 3-5 steps with a railing? : A Lot 6 Click Score: 18    End of Session Equipment Utilized During Treatment: Gait belt Activity Tolerance: Patient tolerated treatment well;Patient limited by fatigue Patient left: in chair;with call bell/phone within reach Nurse Communication: Mobility status PT Visit Diagnosis: Unsteadiness on feet (R26.81);Other abnormalities of gait and mobility (R26.89);Difficulty in walking, not elsewhere classified (R26.2);Dizziness and giddiness (R42)    Time: 1100-1118 PT Time Calculation (min) (ACUTE ONLY): 18 min   Charges:   PT Evaluation $PT Eval Low Complexity: 1 Low PT Treatments $Therapeutic Activity: 8-22 mins PT General Charges $$ ACUTE PT VISIT: 1 Visit        Precious Bard, PT, DPT Physical Therapist - [place]  07/11/2023, 12:53 PM

## 2023-07-11 NOTE — TOC Transition Note (Signed)
 Transition of Care [facility]) - Discharge Note   Patient Details  Name: Jake Richards MRN: [id number] Date of Birth: 10-29-1955  Transition of Care [facility]) CM/SW Contact:  Bing Quarry, RN Phone Number: 07/11/2023, 4:18 PM   Clinical Narrative:  3/23: Waynetta Sandy letter delivered just as discharge orders came through. Patient declined HH post discharge. No DME ordered. Has PCP. Was informed to contact PCP once at home if changes his mind about needed therapy services or to obtain outpatient referral.    Gabriel Cirri MSN RN CM  RN Case Manager [place]  Transitions of Care Direct Dial: [phone] (Weekends Only) [facility] Main Office Phone: [phone] [facility] Fax: [phone] [email or web address]     Final next level of care: Home/Self Care Barriers to Discharge: Barriers Resolved   Patient Goals and CMS Choice            Discharge Placement                       Discharge Plan and Services Additional resources added to the After Visit Summary for                  DME Arranged: N/A DME Agency: NA       HH Arranged: Refused HH HH Agency: NA        Social Drivers of Health (SDOH) Interventions SDOH Screenings   Food Insecurity: Patient Declined (07/10/2023)  Housing: Unknown (07/10/2023)  Transportation Needs: No Transportation Needs (07/10/2023)  Utilities: Not At Risk (07/10/2023)  Financial Resource Strain: Low Risk  (12/07/2022)   Received from [facility]  Social Connections: Patient Declined (07/10/2023)  Tobacco Use: Medium Risk (07/10/2023)     Readmission Risk Interventions     No data to display

## 2023-07-11 NOTE — Care Management Obs Status (Signed)
 MEDICARE OBSERVATION STATUS NOTIFICATION   Patient Details  Name: Jake Richards MRN: [id number] Date of Birth: 06/08/1955   Medicare Observation Status Notification Given:  Yes    Bing Quarry, RN 07/11/2023, 3:56 PM

## 2023-07-11 NOTE — Plan of Care (Signed)
  Problem: Education: Goal: Knowledge of condition and prescribed therapy will improve Outcome: Progressing   Problem: Physical Regulation: Goal: Complications related to the disease process, condition or treatment will be avoided or minimized Outcome: Progressing   Problem: Health Behavior/Discharge Planning: Goal: Ability to manage health-related needs will improve Outcome: Progressing   Problem: Clinical Measurements: Goal: Ability to maintain clinical measurements within normal limits will improve Outcome: Progressing

## 2023-07-11 NOTE — Progress Notes (Signed)
Per Dr Sreenath, dc tele monitoring
